# Patient Record
Sex: Female | Born: 1975
Health system: Southern US, Community
[De-identification: ages and names within clinical notes are randomized; demographics above are authoritative.]

## PROBLEM LIST (undated history)

## (undated) ENCOUNTER — Inpatient Hospital Stay: Payer: Self-pay

## (undated) DIAGNOSIS — O24419 Gestational diabetes mellitus in pregnancy, unspecified control: Secondary | ICD-10-CM

## (undated) DIAGNOSIS — N809 Endometriosis, unspecified: Secondary | ICD-10-CM

## (undated) DIAGNOSIS — R030 Elevated blood-pressure reading, without diagnosis of hypertension: Secondary | ICD-10-CM

## (undated) HISTORY — PX: DILATION AND CURETTAGE OF UTERUS: SHX78

---

## 2004-07-24 ENCOUNTER — Observation Stay: Payer: Self-pay | Admitting: Obstetrics and Gynecology

## 2004-08-07 ENCOUNTER — Observation Stay: Payer: Self-pay | Admitting: Obstetrics and Gynecology

## 2004-08-08 ENCOUNTER — Observation Stay: Payer: Self-pay | Admitting: Obstetrics and Gynecology

## 2004-08-25 ENCOUNTER — Observation Stay: Payer: Self-pay

## 2004-08-27 ENCOUNTER — Observation Stay: Payer: Self-pay

## 2004-09-07 ENCOUNTER — Observation Stay: Payer: Self-pay | Admitting: Certified Nurse Midwife

## 2004-09-13 ENCOUNTER — Inpatient Hospital Stay: Payer: Self-pay | Admitting: Obstetrics and Gynecology

## 2006-08-01 ENCOUNTER — Emergency Department: Payer: Self-pay | Admitting: General Practice

## 2008-06-30 ENCOUNTER — Emergency Department (HOSPITAL_COMMUNITY): Admission: EM | Admit: 2008-06-30 | Discharge: 2008-06-30 | Payer: Self-pay | Admitting: Emergency Medicine

## 2009-03-11 ENCOUNTER — Emergency Department: Payer: Self-pay | Admitting: Internal Medicine

## 2011-07-23 LAB — URINALYSIS, ROUTINE W REFLEX MICROSCOPIC
Bilirubin Urine: NEGATIVE
Hgb urine dipstick: NEGATIVE
Specific Gravity, Urine: <1.005 — ABNORMAL LOW
pH: 6

## 2011-07-23 LAB — URINE CULTURE

## 2013-03-25 ENCOUNTER — Ambulatory Visit: Payer: Self-pay | Admitting: Obstetrics and Gynecology

## 2013-09-13 ENCOUNTER — Ambulatory Visit: Payer: Self-pay | Admitting: Obstetrics and Gynecology

## 2013-09-13 LAB — HEMOGLOBIN: HGB: 13.1 g/dL (ref 12.0–16.0)

## 2013-09-13 LAB — URINALYSIS, COMPLETE
Bilirubin,UR: NEGATIVE
Ketone: NEGATIVE
Protein: NEGATIVE
Specific Gravity: 1.026 (ref 1.003–1.030)

## 2013-09-23 ENCOUNTER — Ambulatory Visit: Payer: Self-pay | Admitting: Obstetrics and Gynecology

## 2013-10-20 HISTORY — PX: DIAGNOSTIC LAPAROSCOPY: SUR761

## 2014-05-16 ENCOUNTER — Emergency Department: Payer: Self-pay | Admitting: Emergency Medicine

## 2014-05-16 LAB — CBC
HCT: 39.8 % (ref 35.0–47.0)
HGB: 13.3 g/dL (ref 12.0–16.0)
MCH: 29.8 pg (ref 26.0–34.0)
MCHC: 33.5 g/dL (ref 32.0–36.0)
MCV: 89 fL (ref 80–100)
Platelet: 248 10*3/uL (ref 150–440)
RBC: 4.47 10*6/uL (ref 3.80–5.20)
RDW: 12.7 % (ref 11.5–14.5)
WBC: 9 10*3/uL (ref 3.6–11.0)

## 2014-05-16 LAB — HCG, QUANTITATIVE, PREGNANCY: Beta Hcg, Quant.: 6362 m[IU]/mL — ABNORMAL HIGH

## 2014-06-06 ENCOUNTER — Ambulatory Visit: Payer: Self-pay | Admitting: Obstetrics and Gynecology

## 2014-06-06 LAB — HEMOGLOBIN: HGB: 13.5 g/dL (ref 12.0–16.0)

## 2014-06-07 ENCOUNTER — Ambulatory Visit: Payer: Self-pay | Admitting: Obstetrics and Gynecology

## 2014-06-09 LAB — PATHOLOGY REPORT

## 2014-10-20 DIAGNOSIS — O24419 Gestational diabetes mellitus in pregnancy, unspecified control: Secondary | ICD-10-CM

## 2014-10-20 HISTORY — PX: TUBAL LIGATION: SHX77

## 2014-10-20 HISTORY — DX: Gestational diabetes mellitus in pregnancy, unspecified control: O24.419

## 2015-02-09 NOTE — Op Note (Signed)
PATIENT NAME:  Lindsey Bell, Lindsey Bell MR#:  [id number] DATE OF BIRTH:  1976/07/22  DATE OF PROCEDURE:  09/23/2013  PREOPERATIVE DIAGNOSIS: Pelvic pain.   POSTOPERATIVE DIAGNOSES: Pelvic pain plus adhesive disease and possible endometriosis.   SURGERY:  1. Diagnostic laparoscopy.  2. Adhesiolysis, moderate.  3. Ablation of endometriosis.   SURGEON: Ricky L. Logan BoresEvans, MD  ANESTHESIA: General endotracheal.   FINDINGS: Omental adhesions in the midline, approximately midway between the umbilicus and pubis. Scar tissue at the junction of the right tube to the uterus. Significant scarring along the right uterosacral. What appeared to be deposits of white endometriosis in the left posterior cul-de-sac and some red endometriosis at the left ovarian fossa.   ESTIMATED BLOOD LOSS: Minimal.   COMPLICATIONS: None.  DRAIN: In-and-out catheter with a red rubber at the beginning of the case of 50 mL of clear urine.   PROCEDURE IN DETAIL: The patient was consented, taken to the operating room and placed in the supine position, where anesthesia was initiated, and placed in dorsal lithotomy position. Cervix was visualized. A single-tooth and sound were placed in the usual fashion and Steri-Stripped together. Bladder was drained.   An 11 port was placed infraumbilically, and pneumoperitoneum was established. We immediately noted midline omental adhesion.   The 5 mm ports were then placed in bilateral lower quadrants under direct visualization. The patient was placed in Trendelenburg position. We were unable to get to the adhesion close to the abdominal wall from below, and therefore we switched to a dogleg scope, and using long Kleppinger'Bell and long scissors, were able to dissect free the omental adhesions in hemostatic fashion, removing it immediately adjacent to its junction with the abdominal wall.   With the aid of retraction and steeper Trendelenburg, we were able to visualize the pelvis. Again, she had  significant blanching and tightening of the right uterosacral. Most of her pain was to the right side. Again, there was a small area of scar tissue and almost stellate characteristic at the cornua at the right fallopian tube. Possible white endometriosis, posterior cul-de-sac, and red endometriosis at the left fossa, as described above.   We switched back to the single channel scope and regular sized instruments, and then under direct visualization, taking care to avoid bowel and ureter, cauterized the uterosacral and divided it, with immediate release of pressure noted. These areas were seen to be hemostatic.   The areas of white endometriosis in the posterior cul-de-sac were then cauterized, as was the area of red endometriosis in the left ovarian fossa, again taking care to avoid the ureter.   The area at the junction of the right fallopian tube and the uterus was then lightly cauterized and excised using sharp scissors.   Appendix was inspected and seemed to be unremarkable, upper abdomen unremarkable.   Pressure was lowered to 6 mmHg, and these areas were seen to be hemostatic. Pneumoperitoneum was allowed to resolve, and the ports were removed. Incisions were closed with deep of 0 Vicryl. Steri-Strips and Band-Aids were applied.   All instrument, needle and sponge counts were correct. Used 30 mL of 0.5 Sensorcaine in three 10 mL aliquots prior to port placements. The patient tolerated the procedure well and anticipate a routine postoperative course.  ____________________________ Reatha Harpsicky L. Logan BoresEvans, MD rle:lb D: 09/23/2013 08:30:23 ET T: 09/23/2013 08:51:54 ET JOB#: [id number]  cc: Ricky L. Logan BoresEvans, MD, <Dictator> Augustina MoodICK L Mackey Varricchio MD ELECTRONICALLY SIGNED 09/23/2013 12:14

## 2015-02-10 NOTE — Op Note (Signed)
PATIENT NAME:  Lindsey Bell, [name] MR#:  [id number] DATE OF BIRTH:  07/24/1976  DATE OF PROCEDURE:  06/07/2014  PREOPERATIVE DIAGNOSIS:  Incomplete abortion.  POSTOPERATIVE DIAGNOSIS:  Incomplete abortion.  PROCEDURE: Suction dilation and curettage.   SURGEON: Jennell Cornerhomas Schermerhorn, M.D.   ANESTHESIA: LMA.   INDICATIONS: A 39 year old gravida 2, para 1 patient with last menstrual period of 02/27/2014.  A patient with bleeding and cramping for 3 weeks, and ultrasound documents fetal demise following quantitative beta-hCG blood type A+.   PROCEDURE: After adequate general LMA, the patient was placed in dorsal supine position, legs in candy cane stirrups. Lower abdomen, perineum and vagina were prepped in normal fashion. Straight catheterization of the bladder yielded 150 mL of clear urine. A weighted speculum was placed in the posterior vaginal vault. The anterior cervix was grasped with a single-tooth tenaculum and the cervix was dilated to #20 Hanks dilator without difficulty.  A #8 flexible curette was placed into the endometrial cavity. Tissue removed consistent with products of conception. Sharp curettage was performed followed by a repeat suction curettage. No additional tissue. The patient did have a small amount of cervical oozing requiring 0.2 mg intramuscular Methergine to be administered. Good hemostasis ultimately was noted.   ESTIMATED BLOOD LOSS: 25 mL.   URINE OUTPUT: 150 mL.  INTRAOPERATIVE FLUIDS: 400 mL.   DISPOSITION:  The patient was taken to the recovery room in good condition.   ____________________________ Suzy Bouchardhomas J. Schermerhorn, MD tjs:TT D: 06/07/2014 08:05:21 ET T: 06/07/2014 14:19:12 ET JOB#: [id number]  cc: Suzy Bouchardhomas J. Schermerhorn, MD, <Dictator> Suzy BouchardHOMAS J SCHERMERHORN MD ELECTRONICALLY SIGNED 06/10/2014 11:26

## 2015-03-05 LAB — OB RESULTS CONSOLE ABO/RH: RH Type: POSITIVE

## 2015-03-05 LAB — OB RESULTS CONSOLE RUBELLA ANTIBODY, IGM: RUBELLA: IMMUNE

## 2015-03-05 LAB — OB RESULTS CONSOLE HIV ANTIBODY (ROUTINE TESTING): HIV: NONREACTIVE

## 2015-03-05 LAB — OB RESULTS CONSOLE VARICELLA ZOSTER ANTIBODY, IGG: VARICELLA IGG: IMMUNE

## 2015-03-05 LAB — OB RESULTS CONSOLE RPR: RPR: NONREACTIVE

## 2015-03-05 LAB — OB RESULTS CONSOLE ANTIBODY SCREEN: ANTIBODY SCREEN: NEGATIVE

## 2015-03-05 LAB — OB RESULTS CONSOLE HEPATITIS B SURFACE ANTIGEN: Hepatitis B Surface Ag: NEGATIVE

## 2015-03-15 ENCOUNTER — Ambulatory Visit
Admission: RE | Admit: 2015-03-15 | Discharge: 2015-03-15 | Disposition: A | Discharge status: Home / Self Care | Payer: Managed Care, Other (non HMO) | Source: Ambulatory Visit | Attending: Maternal & Fetal Medicine | Admitting: Maternal & Fetal Medicine

## 2015-03-15 VITALS — BP 128/87 | HR 92 | Temp 98.9°F | Resp 18 | Ht 65.0 in | Wt 272.0 lb

## 2015-03-15 DIAGNOSIS — O99810 Abnormal glucose complicating pregnancy: Secondary | ICD-10-CM

## 2015-03-15 DIAGNOSIS — O34219 Maternal care for unspecified type scar from previous cesarean delivery: Secondary | ICD-10-CM

## 2015-03-15 DIAGNOSIS — O3421 Maternal care for scar from previous cesarean delivery: Secondary | ICD-10-CM

## 2015-03-15 DIAGNOSIS — E669 Obesity, unspecified: Secondary | ICD-10-CM

## 2015-03-15 DIAGNOSIS — Z3A09 9 weeks gestation of pregnancy: Secondary | ICD-10-CM

## 2015-03-15 DIAGNOSIS — O99212 Obesity complicating pregnancy, second trimester: Secondary | ICD-10-CM | POA: Diagnosis not present

## 2015-03-15 DIAGNOSIS — R030 Elevated blood-pressure reading, without diagnosis of hypertension: Secondary | ICD-10-CM

## 2015-03-15 HISTORY — DX: Elevated blood-pressure reading, without diagnosis of hypertension: R03.0

## 2015-03-15 LAB — GLUCOSE, CAPILLARY: GLUCOSE-CAPILLARY: 89 mg/dL (ref 65–99)

## 2015-03-15 NOTE — Progress Notes (Addendum)
[facility] Consultation   Chief Complaint: Abnormal 1 hour glucose screen (194)  HPI: Lindsey Bell is a 39 y.o. G3 para 1011 at [redacted]w[redacted]d gestation by LMP and [redacted]w[redacted]d US performed 03/05/2015 who is referred by [facility] regarding her borderline HTN, elevated 1 hour glucose screen.  The patient's pregnancy is also complicated by her morbid obesity.  The patient had a one hour glucose screen on 03/05/2015 which was 194.  She has taken metformin in the past for a diagnosis of PCOS.  The patient has never been treated with antihypertensives, but her blood pressure has been intermittently elevated.  Obstetric History:  1. 09/13/2004 - LTCS of 8 lb 3 oz female at [redacted] weeks gestation for pregnancy complicated by HTN.   2. 05/30/2014 - SAB at [redacted] weeks gestation - anembryonic gestation.  Gynecologic History:  LMP 01/08/2015 Endometriosis diagnosed 2014 PCOS - took metformin  Past Medical History: Patient  has a past medical history of Borderline hypertension.   Past Surgical History: She  has past surgical history that includes Cesarean section (2005) and Diagnostic laparoscopy (2015).   Medications: PN vitamins  Allergies: Patient has No Known Allergies.   Social History: Patient  reports that she has never smoked. She does not have any smokeless tobacco history on file. She reports that she does not drink alcohol or use illicit drugs.  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  Her husband works for a Public house manager firm in [place].  Family History: family history includes Hypertension in her father and mother; Stroke in her maternal grandfather.   Review of Systems:  With the exception of nausea of pregnancy,  a full 12 point review of systems was negative.  Physical Exam: BP 128/87 mmHg  Pulse 92  Temp(Src) 98.9 F (37.2 C)  Resp 18  Ht  (1.651 m)  Wt 272 lb (123.378 kg)  BMI 45.26 kg/m2  SpO2 98%  A full exam was performed at [facility] The  patient is here for advice only  Asessement: 1. Abnormal glucose screen 2. Maternal obesity (morbid) with BMI > 40 3. Borderline HTN 4. Previous cesarean delivery 5. Advanced maternal age  Recommendations: 1. Abnormal glucose screen -- Since the one hour screen is below 200, we would recommend performing a 3 hr GTT prior to making the diagnosis of gestational or pre-gestational diabetes. -- The patient was scheduled to return on Monday, May 30 for a 3 hr GTT here at [facility]. -- If gestational or pre-gestational diabetes is confirmed, we will be happy to see her for assistance with blood sugar management.  We would likely start with metformin, since she has taken this before.  2. Maternal obesity (morbid) with BMI > 40 Now:  We would recommend an extra 1 mg per day of  folic acid.  A prescription was given to her.  The patient will ask her husband if she snores.  If she does, we would recommend pursuing an evaluation for sleep apnea, which could be a contributing factor to her HTN.   We would recommend baseline laboratory studies to include an EKG, a complete metabolic profile, a P/C ratio and a TSH.  The patient can have this blood work at her 12 week visit.  The patient should have a hemoglobin A1C. She can have this drawn at her 12 week visit.  Even if her hemoglobin A1C and her 3 hr GTT are normal, since she failed her early 1 hr screen, she  should have a repeat 3 hr GTT around [redacted] weeks gestation.  The patient was counseled  today about limiting her weight gain this pregnancy to 10-15 lbs. Second trimester:  After [redacted] weeks gestation, we would recommend low-dose aspirin 81 mg per day.  A prescription was given to her.  The patient should have a detailed anatomy ultrasound.  Third trimester:  We would recommend an anesthesia consult in the third trimester.   We would recommend fetal surveillance to include monthly ultrasounds starting at 28 weeks.   See additional recommendations for  fetal surveillance below under borderline HTN. At delivery:  We would recommend pneumatic compression devices in labor and postpartum.  We would recommend consideration of 3 gm instead of 2 gm of cefazolin for prophylaxis at the time of her repeat cesarean delivery.  At the time of a cesarean delivery, we would recommend application of a negative pressure dressing.   BMI > 40 increases the risk of thrombosis 4-5 fold.  We would suggest consideration of enoxaparin 40 mg qd x 3 weeks starting at 24 hours post-cesarean, especially if she develops any other risk factors.    Postpartum:  We would recommend referral for weight loss management and consideration of bariatric surgery. 3. Borderline HTN  After [redacted] weeks gestation, we would recommend low-dose aspirin 81 mg per day.  A prescription was given to her.  Besides monthly ultrasounds starting at 28 weeks, we would recommend weekly antepartum testing starting at 32-[redacted] weeks gestation.  If she develops overt hypertension or diabetes, the frequency should be increased to twice weekly starting at [redacted] weeks gestation.  She should be delivered at [redacted] weeks gestation or sooner depending on maternal or fetal status. 4. Previous cesarean delivery  The patient desires repeat cesarean delivery.    Given her risk factors, she should be offered BTL at the time of surgery. 5.  Advanced maternal age  The patient will have cell free fetal DNA testing at [redacted] weeks gestation.  Fetal surveillance as above.  Total time spent with the patient was 60 minutes with greater than 50% spent in counseling and coordination of care.  We appreciate this consult and will be happy to be involved in the ongoing care of Lindsey Bell in anyway her obstetricians desire.  Argentina Ponder, MD [facility]  Addendum:  3 hr GTT from 03/19/2015 - 99/134/146/83 which is normal using the National Diabetes Group Criteria.  Message left that her GTT was normal, although her FBS  was borderline (and would be abnormal using the Carpenter/Coustan criteria).  At a minimum we would repeat her GTT at 28 weeks and she should have me paged on 03/22/2015.  At that time, we will recommend a diabetic diet, and we can discuss her restarting metformin. I apprised Dr. Dolphus Jenny of this patient and her results.   In the event I am unavailable, the patient can speak to Dr. Dolphus Jenny.  Argentina Ponder, MD [facility]  03/28/2105 - I left a message for the patient and she called me back.  She had received my previous message.  In order to be optimally pro-active in her situation, I recommended restarting metformin 500 mg po bid and monitoring her blood sugars - one fasting BS qd and one rotating 2 hr PP.  A prescription for metformin was called to her pharmacy and she was scheduled an appointment with [facility] to be instructed in blood glucose monitoring.  She will return to [facility] in one month.  Lindsey Bell  Lindsey GitelmanH. Oluwakemi Salsberry, MD [facility]

## 2015-03-19 ENCOUNTER — Other Ambulatory Visit
Admission: RE | Admit: 2015-03-19 | Discharge: 2015-03-19 | Disposition: A | Discharge status: Home / Self Care | Payer: BLUE CROSS/BLUE SHIELD | Source: Ambulatory Visit | Attending: Maternal & Fetal Medicine | Admitting: Maternal & Fetal Medicine

## 2015-03-19 DIAGNOSIS — O99810 Abnormal glucose complicating pregnancy: Secondary | ICD-10-CM | POA: Diagnosis not present

## 2015-03-19 LAB — GLUCOSE, FASTING: GLUCOSE, FASTING: 99 mg/dL (ref 65–99)

## 2015-03-19 LAB — GLUCOSE, 1 HOUR GESTATIONAL: GLUCOSE, 1 HOUR-GESTATIONAL: 134 mg/dL (ref 70–189)

## 2015-03-19 LAB — GLUCOSE, 3 HOUR GESTATIONAL: GLUCOSE 3 HOUR GTT: 83 mg/dL (ref 70–144)

## 2015-03-19 LAB — GLUCOSE, 2 HOUR GESTATIONAL: GLUCOSE, 2 HOUR-GESTATIONAL: 146 mg/dL (ref 70–164)

## 2015-03-20 NOTE — Addendum Note (Signed)
Encounter addended by: Lady DeutscherAndra Roxsana Riding, MD on: 03/20/2015  2:15 PM<BR>     Documentation filed: Notes Section

## 2015-03-29 NOTE — Addendum Note (Signed)
Encounter addended by: Lady Deutscher, MD on: 03/29/2015 10:25 AM<BR>     Documentation filed: Notes Section, Letters

## 2015-04-16 ENCOUNTER — Encounter: Payer: BLUE CROSS/BLUE SHIELD | Attending: Maternal & Fetal Medicine | Admitting: *Deleted

## 2015-04-16 ENCOUNTER — Encounter: Payer: Self-pay | Admitting: *Deleted

## 2015-04-16 VITALS — BP 130/84 | Ht 65.0 in | Wt 273.2 lb

## 2015-04-16 DIAGNOSIS — O24419 Gestational diabetes mellitus in pregnancy, unspecified control: Secondary | ICD-10-CM

## 2015-04-16 NOTE — Patient Instructions (Signed)
Read booklet on Gestational Diabetes Follow Gestational Meal Planning Guidelines Complete a 3 Day Food Record and bring to next appointment Check blood sugars 4 x day - before breakfast and 2 hrs after every meal and record  Call MD for prescription for meter strips and lancets Strips   One Touch Ultra Blue  Lancets   One Touch Delica Bring blood sugar log to next appointment Walk 20-30 minutes at least 5 x week if permitted by MD

## 2015-04-16 NOTE — Progress Notes (Signed)
Diabetes Self-Management Education  Visit Type: First/Initial  Appt. Start Time: 0845 Appt. End Time: 1015  04/16/2015  Ms. Lindsey Bell, identified by name and date of birth, is a 39 y.o. female with a diagnosis of Diabetes: Gestational Diabetes.    ASSESSMENT Blood pressure 130/84, height 5\' 5"  (1.651 m), weight 273 lb 3.2 oz (123.923 kg), last menstrual period 01/08/2015. Body mass index is 45.46 kg/(m^2).  Initial Visit Information: Are you currently following a meal plan?: Yes What type of meal plan do you follow?: eating smaller meals and more often; cut out white bread Are you taking your medications as prescribed?: Yes Are you checking your feet?: No How often do you need to have someone help you when you read instructions, pamphlets, or other written materials from your doctor or pharmacy?: 1 - Never What is the last grade level you completed in school?: 12th  Psychosocial: Patient Belief/Attitude about Diabetes: Motivated to manage diabetes Self-care barriers: None Self-management support: Doctor's office, Family Patient Concerns: Nutrition/Meal planning, Healthy Lifestyle Special Needs: None Preferred Learning Style: Auditory, Visual Learning Readiness: Change in progress  Complications:  How often do you check your blood sugar?: 3-4 times/day Fasting Blood glucose range (mg/dL): 96-04570-129 (Pt reports FBG's 91-104 mg/dL) Postprandial Blood glucose range (mg/dL): 40-98170-129 (Pt reports pp 75-105 mg/dL with 1 reading of 191138 mg/dL when she ate out. ) Have you had a dilated eye exam in the past 12 months?: No Have you had a dental exam in the past 12 months?: No  Diet Intake: Breakfast: eats 3  meals/day Snack (morning): eats 2 snacks/day  Exercise: Exercise: ADL's  Individualized Plan for Diabetes Self-Management Training:  Learning Objective:  Patient will have a greater understanding of diabetes self-management.  Education Topics Reviewed with Patient  Today: Definition of diabetes, type 1 and 2, and the diagnosis of diabetes, Factors that contribute to the development of diabetes Role of diet in the treatment of diabetes and the relationship between the three main macronutrients and blood glucose level Role of exercise on diabetes management, blood pressure control and cardiac health. Reviewed patients medication for diabetes, action, purpose, timing of dose and side effects. Purpose and frequency of SMBG., Identified appropriate SMBG and/or A1C goals. Relationship between chronic complications and blood glucose control Identified and addressed patients feelings and concerns about diabetes Pregnancy and GDM  Role of pre-pregnancy blood glucose control on the development of the fetus, Reviewed with patient blood glucose goals with pregnancy  PATIENTS GOALS/Plan (Developed by the patient): Lead a healthier lifestyle  Plan:  Read booklet on Gestational Diabetes Follow Gestational Meal Planning Guidelines Complete a 3 Day Food Record and bring to next appointment Check blood sugars 4 x day - before breakfast and 2 hrs after every meal and record  Call MD for prescription for meter strips and lancets Strips   One Touch Ultra Blue  Lancets   One Touch Delica Bring blood sugar log to next appointment Walk 20-30 minutes at least 5 x week if permitted by MD  Expected Outcomes:  Demonstrated interest in learning. Expect positive outcomes  Education material provided:  Gestational Meal Planning Guidelines Viewed Gestational Diabetes Video 3 Day Food Record Goals for a Healthy Pregnancy  If problems or questions, patient to contact team via:   Sharion SettlerSheila Shotwell, RN, CCM, CDE [phone]  Future DSME appointment:  Tuesday May 08, 2015 at 9:00 am with dietitian

## 2015-04-26 ENCOUNTER — Ambulatory Visit
Admission: RE | Admit: 2015-04-26 | Discharge: 2015-04-26 | Disposition: A | Discharge status: Home / Self Care | Payer: BLUE CROSS/BLUE SHIELD | Source: Ambulatory Visit | Attending: Obstetrics and Gynecology | Admitting: Obstetrics and Gynecology

## 2015-04-26 VITALS — BP 124/82 | HR 91 | Temp 98.3°F | Resp 18 | Ht 66.0 in | Wt 271.2 lb

## 2015-04-26 DIAGNOSIS — O09522 Supervision of elderly multigravida, second trimester: Secondary | ICD-10-CM | POA: Diagnosis not present

## 2015-04-26 DIAGNOSIS — O24419 Gestational diabetes mellitus in pregnancy, unspecified control: Secondary | ICD-10-CM

## 2015-04-26 DIAGNOSIS — O99212 Obesity complicating pregnancy, second trimester: Secondary | ICD-10-CM

## 2015-04-26 DIAGNOSIS — Z3A15 15 weeks gestation of pregnancy: Secondary | ICD-10-CM | POA: Diagnosis not present

## 2015-04-26 DIAGNOSIS — Z9889 Other specified postprocedural states: Secondary | ICD-10-CM

## 2015-04-26 DIAGNOSIS — E669 Obesity, unspecified: Secondary | ICD-10-CM

## 2015-04-26 DIAGNOSIS — O169 Unspecified maternal hypertension, unspecified trimester: Secondary | ICD-10-CM | POA: Insufficient documentation

## 2015-04-26 DIAGNOSIS — Z8742 Personal history of other diseases of the female genital tract: Secondary | ICD-10-CM

## 2015-04-26 DIAGNOSIS — Z98891 History of uterine scar from previous surgery: Secondary | ICD-10-CM | POA: Insufficient documentation

## 2015-04-26 DIAGNOSIS — O162 Unspecified maternal hypertension, second trimester: Secondary | ICD-10-CM

## 2015-04-26 DIAGNOSIS — N809 Endometriosis, unspecified: Secondary | ICD-10-CM | POA: Diagnosis not present

## 2015-04-26 DIAGNOSIS — O09529 Supervision of elderly multigravida, unspecified trimester: Secondary | ICD-10-CM | POA: Insufficient documentation

## 2015-04-26 NOTE — Consult Note (Addendum)
[facility] Consultation   Chief Complaint: pregnant with gestational diabetes and h/o chronic HTN  HPI: Ms. Lindsey Bell is a 39 y.o. G3 para 1011 at 32 w 3 gestation by LMP and [redacted]w[redacted]d US performed 03/05/2015 who is referred by [facility] regarding her borderline HTN, borderline glucose tolerance ( early GCT 194/ normal early GTT 99/134/146/83 ,Dr Fayrene Fearing placed her on metformin ,  morbid obesity BMI 40 .  The patient had a one hour glucose screen on 03/05/2015 which was 194. She has taken metformin in the past for a diagnosis of PCOS. She re- started metformin after her consult with Dr Fayrene Fearing and has been checking her blood sugars 4 times /daily.   The patient has never been treated with antihypertensives, but her blood pressure has been intermittently elevated.  Obstetric History:  1. 09/13/2004 - LTCS of 8 lb 3 oz female at [redacted] weeks gestation for pregnancy complicated by HTN.  2. 05/30/2014 - SAB at [redacted] weeks gestation - anembryonic gestation.  Gynecologic History: LMP 01/08/2015 Endometriosis diagnosed 2014 PCOS - took metformin  Past Medical History: Patient  has a past medical history of Borderline hypertension.   Past Surgical History: She  has past surgical history that includes Cesarean section (2005) and Diagnostic laparoscopy (2015) for endometriosis (she conceived shortly after)   Medications: PN vitamins  Allergies: Patient has No Known Allergies.   Social History: Patient  reports that she has never smoked. She does not have any smokeless tobacco history on file. She reports that she does not drink alcohol or use illicit drugs.  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  Family History: family history includes Hypertension in her father and mother; Stroke in her maternal grandfather.   Review of Systems: some breast tenderness  a full 12 point review of systems was negative.  Physical  Exam: Filed Vitals:   04/26/15 0918  BP: 124/82  Pulse: 91  Temp: 98.3 F (36.8 C)  Resp: 18  obese pleasant white female in no distress  Her weight has dropped one pound to 271  Scar is well healed - minimal pannus most of her weight is higher on her abdomen  FHR =150   BS last night 102  All between 85-115  Single BS of 138 after eating at the [facility]   Asessement: 1. Abnormal glucose tolerance early GCT 194 /normal GTT now on metformin  2. Maternal obesity (morbid) with BMI > 40 3. Borderline HTN no meds on aspirin 4. Previous cesarean delivery- plans repeat  5. Advanced maternal age- awaiting cell free results   Recommendations: 1. Glucose intolerance  - doing well on metformin  All blood sugars are normal on her machine except for 138 that occurred after eating out for Fathers day  I told her she could go to qod blood sugar checks with care to stay on diet on her off days  Continue metformin - I reviewed the expanding safety data for use in pregnancy  Strips were re-filled for every other day use 4 times/day Dr Fayrene Fearing had recommended a third tri GTT , given pt had a near diagnostic GCT early on  (I use 200) I would manage as GDM - with her other co morbidities it will not change OB management significantly 2. Maternal obesity (morbid) with BMI > 40 She has done a good job of limiting  Second trimester:  The patient should have  a detailed anatomy ultrasound. Third trimester:  We would recommend an anesthesia consult in the third trimester.   We would recommend fetal surveillance to include monthly ultrasounds starting at 28 weeks. See additional recommendations for fetal surveillance below under borderline HTN. At delivery:  We would recommend pneumatic compression devices in labor and postpartum.  We would recommend consideration of 3 gm instead of 2 gm of cefazolin for prophylaxis at the time of her repeat cesarean delivery.  At the time of a cesarean  delivery, we would recommend application of a negative pressure dressing if available .   If prolonged immobilization consider prophylactic lovenox for increased risk of DVT  Postpartum:  We would recommend referral for weight loss management and consideration of bariatric surgery. 3. Borderline HTN- normotensive today   On  low-dose aspirin 81 mg per day.   Recommend baseline urine p/c and renal and liver chemistries if not already done   Besides monthly ultrasounds starting at 28 weeks, we would recommend weekly antepartum testing starting at 32-[redacted] weeks gestation. If she develops overt hypertension or diabetes, the frequency should be increased to twice weekly starting at [redacted] weeks gestation. She should be delivered at [redacted] weeks gestation or sooner depending on maternal or fetal status. 4. Previous cesarean delivery 2005  The patient desires repeat cesarean delivery.  5. Advanced maternal age  The patient is awaiting  cell free fetal DNA testing results In summary  RTC in 8 weeks or sooner if insulin is needed or if BP increases Anatomy u/s needed at 18 weeks  AFP only is an option if spine not well seen or mother would like for reassurance  QOD sugars, if remain normal can decrease frequency further  Continue aspirin Baseline labs cmp , urine p/c , hgb A1c needed ( not drawn here)   I spent 20 minutes with the pt Jimmey RalphLivingston, Andraya Frigon, MD

## 2015-05-07 ENCOUNTER — Ambulatory Visit: Payer: Managed Care, Other (non HMO)

## 2015-05-08 ENCOUNTER — Ambulatory Visit: Payer: Managed Care, Other (non HMO) | Admitting: Dietician

## 2015-05-11 ENCOUNTER — Encounter: Payer: BLUE CROSS/BLUE SHIELD | Attending: Maternal & Fetal Medicine | Admitting: Dietician

## 2015-05-11 VITALS — BP 120/76 | Ht 65.0 in | Wt 270.1 lb

## 2015-05-11 DIAGNOSIS — O24419 Gestational diabetes mellitus in pregnancy, unspecified control: Secondary | ICD-10-CM | POA: Diagnosis present

## 2015-05-11 NOTE — Patient Instructions (Signed)
   Include protein and solid starch such as graham crackers with nighttime snack to take Metformin.   Waking up and not sleeping well during the night can also increase morning blood sugars.  When not feeling well, eat small amounts of foods that appeal to you every 2-3 hours.

## 2015-05-11 NOTE — Progress Notes (Signed)
   Patient's BG record indicates BGs generally within goal ranges. She will start taking Metformin at bedtime with a snack for more improvement in fasting BGs (per MD recommendation).   Patient's food diary indicates overall balanced meals and controlled carb intake. She reports poor appetite, particularly in the past week, which might be due to recently diagnosed UTI.           She is eating every few hours in effort to prevent nausea. Breakfast meal and some snacks lack a protein source.   Provided 1800kcal meal plan, and wrote individualized menus based on patient's food preferences.  Advised patient to include solid food including protein source for evening snack to take Metformin in order to avoid GI distress.   Instructed patient on food safety, including avoidance of Listeriosis, and limiting mercury from fish.  Discussed importance of maintaining healthy lifestyle habits to reduce risk of Type 2 DM as well as Gestational DM with any future pregnancies.  Advised patient to use any remaining testing supplies to test some BGs after delivery, and to have BG tested ideally annually, as well as prior to attempting future pregnancies.

## 2015-06-28 ENCOUNTER — Ambulatory Visit: Payer: Managed Care, Other (non HMO)

## 2015-07-05 ENCOUNTER — Ambulatory Visit
Admission: RE | Admit: 2015-07-05 | Discharge: 2015-07-05 | Disposition: A | Discharge status: Home / Self Care | Payer: Managed Care, Other (non HMO) | Source: Ambulatory Visit | Attending: Obstetrics & Gynecology | Admitting: Obstetrics & Gynecology

## 2015-07-05 VITALS — BP 119/82 | HR 100 | Temp 98.3°F | Resp 18 | Ht 64.8 in | Wt 268.2 lb

## 2015-07-05 DIAGNOSIS — O24419 Gestational diabetes mellitus in pregnancy, unspecified control: Secondary | ICD-10-CM

## 2015-07-05 DIAGNOSIS — Z98891 History of uterine scar from previous surgery: Secondary | ICD-10-CM

## 2015-07-05 DIAGNOSIS — O99212 Obesity complicating pregnancy, second trimester: Secondary | ICD-10-CM

## 2015-07-05 DIAGNOSIS — O162 Unspecified maternal hypertension, second trimester: Secondary | ICD-10-CM

## 2015-07-05 DIAGNOSIS — O09522 Supervision of elderly multigravida, second trimester: Secondary | ICD-10-CM

## 2015-07-05 HISTORY — DX: Gestational diabetes mellitus in pregnancy, unspecified control: O24.419

## 2015-07-05 NOTE — Progress Notes (Addendum)
Maternal-Fetal Medicine Return Consult: Margareth is a 39 year-old G3 P1011 at 51 3/7 weeks (EDC 10/15/15) who returns for followup consult. See prior MFM consult notes dated 04/26/15 (Dr. Leatha Gilding) and 03/15/15.  Sumie is being followed for elevated BMI, advanced maternal age, history of prior cesarean delivery, history of hypertension and glucose intolerance in pregnancy.  She had an early one-hour glucola of 194 followed by a three hour GTT of 99/134/146/83.  Due to her borderline values and history of PCOS, she was started on metformin and has been checking her sugars. She had been on metformin 500 mg every 12 hours, and was increased last week at [facility] to 1000 mg every 12 hours due to mildly elevated fasting and 2hr postprandial dinner values.  Sundeep is without complaint. She states she had a normal anatomy scan at [facility] and had normal cell-free fetal DNA testing there as well.  She is tolerating her metformin and taking a daily baby aspirin.  Exam: Filed Vitals:   07/05/15 0900  BP: 119/82  Pulse: 100  Temp: 98.3 F (36.8 C)  Resp: 18   Sugars (since increasing to Metformin  twice daily): FS: 100, 91, 93, 98, 99, 100, 94 2hr PP breakfast: 97, 123, 96, 99, 10, 107 2hr PP lunch: 104, 114, 109, 104, 110, 103 2hr PP dinner: 130, 123, 119, 136, 120, 123  Assessment and Recommendations: -Blood sugars are largely in range though fasting values are slightly elevated. Pt will continue to adjust her diet, avoiding concentrated sweets and limiting carbs -Please check a hemoglobin A1C if not done already. Notes -Pregnancy/fetal testing recs as outline in two prior consults -Timing of delivery dependent on control. Pt desires repeat cesarean -Continue metformin at current dose for now.  Call with questions. RTC 4 weeks or sooner if issues arise -Baby aspirin, folic acid and test strip renewal scripts provided (called in)  Eamon Tantillo, [place] A, MD

## 2015-07-26 ENCOUNTER — Encounter: Payer: Self-pay | Admitting: *Deleted

## 2015-07-26 ENCOUNTER — Inpatient Hospital Stay
Admission: EM | Admit: 2015-07-26 | Discharge: 2015-07-26 | Disposition: A | Discharge status: Home / Self Care | Payer: BLUE CROSS/BLUE SHIELD | Attending: Advanced Practice Midwife | Admitting: Advanced Practice Midwife

## 2015-07-26 DIAGNOSIS — O09522 Supervision of elderly multigravida, second trimester: Secondary | ICD-10-CM

## 2015-07-26 DIAGNOSIS — Z3A28 28 weeks gestation of pregnancy: Secondary | ICD-10-CM | POA: Insufficient documentation

## 2015-07-26 DIAGNOSIS — O162 Unspecified maternal hypertension, second trimester: Secondary | ICD-10-CM | POA: Diagnosis not present

## 2015-07-26 DIAGNOSIS — O24419 Gestational diabetes mellitus in pregnancy, unspecified control: Secondary | ICD-10-CM | POA: Diagnosis not present

## 2015-07-26 DIAGNOSIS — E669 Obesity, unspecified: Secondary | ICD-10-CM | POA: Diagnosis not present

## 2015-07-26 DIAGNOSIS — Z87891 Personal history of nicotine dependence: Secondary | ICD-10-CM | POA: Diagnosis not present

## 2015-07-26 DIAGNOSIS — R030 Elevated blood-pressure reading, without diagnosis of hypertension: Secondary | ICD-10-CM | POA: Diagnosis present

## 2015-07-26 LAB — COMPREHENSIVE METABOLIC PANEL
ALT: 30 U/L (ref 14–54)
ANION GAP: 8 (ref 5–15)
AST: 22 U/L (ref 15–41)
Albumin: 3.6 g/dL (ref 3.5–5.0)
Alkaline Phosphatase: 67 U/L (ref 38–126)
BILIRUBIN TOTAL: 0.4 mg/dL (ref 0.3–1.2)
BUN: 10 mg/dL (ref 6–20)
CHLORIDE: 105 mmol/L (ref 101–111)
CO2: 25 mmol/L (ref 22–32)
Calcium: 10.1 mg/dL (ref 8.9–10.3)
Creatinine, Ser: 0.63 mg/dL (ref 0.44–1.00)
GFR calc Af Amer: >60 mL/min (ref >60)
Glucose, Bld: 88 mg/dL (ref 65–99)
POTASSIUM: 4.1 mmol/L (ref 3.5–5.1)
Sodium: 138 mmol/L (ref 135–145)
TOTAL PROTEIN: 7.2 g/dL (ref 6.5–8.1)

## 2015-07-26 LAB — CBC WITH DIFFERENTIAL/PLATELET
BASOS ABS: 0 10*3/uL (ref 0–0.1)
BASOS PCT: 0 %
EOS ABS: 0 10*3/uL (ref 0–0.7)
EOS PCT: 1 %
HCT: 39.2 % (ref 35.0–47.0)
HEMOGLOBIN: 13.2 g/dL (ref 12.0–16.0)
Lymphocytes Relative: 14 %
Lymphs Abs: 1.3 10*3/uL (ref 1.0–3.6)
MCH: 29.4 pg (ref 26.0–34.0)
MCHC: 33.8 g/dL (ref 32.0–36.0)
MCV: 87 fL (ref 80.0–100.0)
Monocytes Absolute: 0.6 10*3/uL (ref 0.2–0.9)
Monocytes Relative: 6 %
NEUTROS PCT: 79 %
Neutro Abs: 7.7 10*3/uL — ABNORMAL HIGH (ref 1.4–6.5)
PLATELETS: 232 10*3/uL (ref 150–440)
RBC: 4.51 MIL/uL (ref 3.80–5.20)
RDW: 13.3 % (ref 11.5–14.5)
WBC: 9.6 10*3/uL (ref 3.6–11.0)

## 2015-07-26 LAB — PROTEIN / CREATININE RATIO, URINE: CREATININE, URINE: 83 mg/dL

## 2015-07-26 MED ORDER — HYDRALAZINE HCL 20 MG/ML IJ SOLN: 10.0000 mg | Freq: Once | INTRAMUSCULAR | Status: DC | PRN

## 2015-07-26 MED ORDER — LABETALOL HCL 5 MG/ML IV SOLN
20.0000 mg | INTRAVENOUS | Status: DC | PRN
Start: 2015-07-26 — End: 2015-07-26

## 2015-07-26 NOTE — Discharge Summary (Signed)
Patient discharged home, discharge instructions given, patient states understanding. Patient left floor in stable condition, denies any other needs at this time. Patient to keep next scheduled OB appointment. Preeclampsia precautions reviewed.

## 2015-07-26 NOTE — OB Triage Note (Signed)
Pt  Arrived to obs rm 2 for monitoring of preclampsia with gestational DM and HTN. Pt placed on monitors

## 2015-07-26 NOTE — H&P (Signed)
Obstetric History and Physical  Lindsey Bell is a 39 y.o. G3P1010 with Estimated Date of Delivery: 10/15/15 per LMP and 8 wk [place] who presents at [redacted]w[redacted]d for pre-eclampsia evaluation after having elevated BPs at home and at office this am (128-182/76-114 at home, 156/92 at office.) Denies ha, visual changes. Pt has h/o PCOS, CHTN and GDM, she is currently on Metformin 1000 mg BID although she had not taken it for the past few days and Glyburide 2.5 mg QPM. No meds for CHTN. Pt reports the last 2 night having low FSBS. Overnight pt's glucose was 41 and she "didn't feel right". After eating, glucose returned to normal, but she continued to not feel well and proceeded to check BPs as above.  No contractions, no vaginal bleeding, intact membranes, with active fetal movement.    Prenatal Course Source of Care: [facility]  with onset of care at 5 weeks Pregnancy complications or risks: Patient Active Problem List   Diagnosis Date Noted  . Elderly multigravida with antepartum condition or complication 04/26/2015  . Hypertension in pregnancy, antepartum 04/26/2015  . Glucose intolerance of pregnancy  04/26/2015  . History of cesarean delivery 04/26/2015  . History of PCOS 04/26/2015  . Endometriosis determined by laparoscopy 04/26/2015  . Obesity affecting pregnancy in second trimester 04/26/2015     Prenatal Transfer Tool   Past Medical History  Diagnosis Date  . Borderline hypertension   . Gestational diabetes 2016    Past Surgical History  Procedure Laterality Date  . Cesarean section  2005  . Diagnostic laparoscopy  2015    OB History  Gravida Para Term Preterm AB SAB TAB Ectopic Multiple Living  # Outcome Date GA Lbr Len/2nd Weight Sex Delivery Anes PTL Lv  3 Current           2 SAB           1 Term    8 lb 3 oz (3.714 kg)           Social History   Social History  . Marital Status: Married    Spouse Name: N/A  . Number of Children: N/A  . Years of  Education: N/A   Social History Main Topics  . Smoking status: Former Smoker -- 0.50 packs/day for 15 years    Types: Cigarettes    Quit date: 10/21/2011  . Smokeless tobacco: Never Used  . Alcohol Use: No  . Drug Use: No  . Sexual Activity: Yes   Other Topics Concern  . None   Social History Narrative    Family History  Problem Relation Age of Onset  . Hypertension Mother   . Hypertension Father   . Stroke Maternal Grandfather     Prescriptions prior to admission  Medication Sig Dispense Refill Last Dose  . aspirin 81 MG tablet Take 81 mg by mouth daily.   07/26/2015 at Unknown time  . folic acid (FOLVITE) 1 MG tablet Take 1 mg by mouth daily.   07/25/2015 at Unknown time  . ONE TOUCH ULTRA TEST test strip TEST 4 TIMES A DAY EVERY OTHER DAY  0 07/26/2015 at Unknown time  . ONETOUCH DELICA LANCETS 33G MISC See admin instructions.  0 07/26/2015 at Unknown time  . Prenatal Vit-Fe Fumarate-FA (MULTIVITAMIN-PRENATAL) 27-0.8 MG TABS tablet Take 1 tablet by mouth daily at 12 noon.   07/26/2015 aMERSADES Bell  . metFORMIN (GLUCOPHAGE) 500 MG tablet  Take 1,000 mg by mouth 2 (two) times daily.   0 07/23/2015 at Unknown time        Glyburide 2.5 mg QPM   No Known Allergies  Review of Systems: Negative except for what is mentioned in HPI.  Physical Exam: BP 113-141/61-85 mmHg  Pulse 83  Temp(Src) 99.3 F (37.4 C) (Oral)  LMP 01/08/2015 GENERAL: Well-developed, well-nourished female in no acute distress.  ABDOMEN: Soft, nontender, nondistended, gravid. EXTREMITIES: Nontender, no edema Cervical Exam:deferred FHT: Category: 1 for gestational age, baseline 140-150s  Contractions: none   Pertinent Labs/Studies:   Results for orders placed or performed during the hospital encounter of 07/26/15 (from the past 24 hour(s))  Comprehensive metabolic panel     Status: None   Collection Time: 07/26/15 11:18 AM  Result Value Ref Range   Sodium 138 135 - 145 mmol/L   Potassium 4.1 3.5 -  5.1 mmol/L   Chloride 105 101 - 111 mmol/L   CO2 25 22 - 32 mmol/L   Glucose, Bld 88 65 - 99 mg/dL   BUN 10 6 - 20 mg/dL   Creatinine, Ser 5.28 0.44 - 1.00 mg/dL   Calcium 41.3 8.9 - 24.4 mg/dL   Total Protein 7.2 6.5 - 8.1 g/dL   Albumin 3.6 3.5 - 5.0 g/dL   AST 22 15 - 41 U/L   ALT 30 14 - 54 U/L   Alkaline Phosphatase 67 38 - 126 U/L   Total Bilirubin 0.4 0.3 - 1.2 mg/dL   GFR calc non Af Amer >60 >60 mL/min   GFR calc Af Amer >60 >60 mL/min   Anion gap 8 5 - 15  CBC with Differential     Status: Abnormal   Collection Time: 07/26/15 11:18 AM  Result Value Ref Range   WBC 9.6 3.6 - 11.0 K/uL   RBC 4.51 3.80 - 5.20 MIL/uL   Hemoglobin 13.2 12.0 - 16.0 g/dL   HCT 01.0 27.2 - 53.6 %   MCV 87.0 80.0 - 100.0 fL   MCH 29.4 26.0 - 34.0 pg   MCHC 33.8 32.0 - 36.0 g/dL   RDW 64.4 03.4 - 74.2 %   Platelets 232 150 - 440 K/uL   Neutrophils Relative % 79 %   Neutro Abs 7.7 (H) 1.4 - 6.5 K/uL   Lymphocytes Relative 14 %   Lymphs Abs 1.3 1.0 - 3.6 K/uL   Monocytes Relative 6 %   Monocytes Absolute 0.6 0.2 - 0.9 K/uL   Eosinophils Relative 1 %   Eosinophils Absolute 0.0 0 - 0.7 K/uL   Basophils Relative 0 %   Basophils Absolute 0.0 0 - 0.1 K/uL   Awaiting PC Ratio  Assessment : IUP at [redacted]w[redacted]d, CHTN, GDM  Plan: Awaiting PC Ratio  F/u at office scheduled for 10/10 D/t drops in FSBS overnight, advised pt to d/c glucose agents and continue monitoring FSBS, F/u at office for continued plan for glucose management.

## 2015-07-26 NOTE — Discharge Instructions (Signed)
Drink plenty of fluid and get plenty of rest, call your provider for any other concerns °

## 2015-08-02 ENCOUNTER — Ambulatory Visit: Payer: Managed Care, Other (non HMO)

## 2015-08-09 ENCOUNTER — Ambulatory Visit: Payer: Managed Care, Other (non HMO)

## 2015-09-20 ENCOUNTER — Encounter: Payer: Self-pay | Admitting: *Deleted

## 2015-09-20 ENCOUNTER — Observation Stay
Admission: EM | Admit: 2015-09-20 | Discharge: 2015-09-21 | Disposition: A | Discharge status: Home / Self Care | Payer: BLUE CROSS/BLUE SHIELD | Attending: Obstetrics & Gynecology | Admitting: Obstetrics & Gynecology

## 2015-09-20 DIAGNOSIS — M549 Dorsalgia, unspecified: Secondary | ICD-10-CM | POA: Diagnosis present

## 2015-09-20 DIAGNOSIS — O24419 Gestational diabetes mellitus in pregnancy, unspecified control: Secondary | ICD-10-CM

## 2015-09-20 DIAGNOSIS — Z3A36 36 weeks gestation of pregnancy: Secondary | ICD-10-CM | POA: Insufficient documentation

## 2015-09-20 DIAGNOSIS — O09522 Supervision of elderly multigravida, second trimester: Secondary | ICD-10-CM

## 2015-09-20 DIAGNOSIS — O26893 Other specified pregnancy related conditions, third trimester: Secondary | ICD-10-CM | POA: Insufficient documentation

## 2015-09-20 DIAGNOSIS — O162 Unspecified maternal hypertension, second trimester: Secondary | ICD-10-CM

## 2015-09-20 LAB — URINALYSIS COMPLETE WITH MICROSCOPIC (ARMC ONLY)
Bilirubin Urine: NEGATIVE
Glucose, UA: NEGATIVE mg/dL
Hgb urine dipstick: NEGATIVE
Ketones, ur: NEGATIVE mg/dL
Leukocytes, UA: NEGATIVE
Nitrite: NEGATIVE
PROTEIN: NEGATIVE mg/dL
RBC / HPF: NONE SEEN RBC/hpf (ref 0–5)
Specific Gravity, Urine: 1.002 — ABNORMAL LOW (ref 1.005–1.030)
pH: 6 (ref 5.0–8.0)

## 2015-09-20 MED ORDER — ACETAMINOPHEN 325 MG PO TABS: 650.0000 mg | ORAL_TABLET | ORAL | Status: DC | PRN

## 2015-09-21 ENCOUNTER — Encounter: Payer: Self-pay | Admitting: Obstetrics & Gynecology

## 2015-09-21 NOTE — Discharge Summary (Signed)
Lindsey RandyJamie S Bell is a 39 y.o. female. She is at 5232w4d gestation.  Indication: contractions, back pain  S: Resting comfortably. occasional CTX, no VB. Active fetal movement. Concerned about preterm labor - having significant back pain and tightening of the uterus about 5-10 mins apart.  She did not have intercourse recently, but did have watery diarrhea this morning x1. O:  BP 118/88 mmHg  Pulse 96  Temp(Src) 98.3 F (36.8 C) (Oral)  LMP 01/08/2015   Results for orders placed or performed during the hospital encounter of 09/20/15 (from the past 48 hour(s))  Urinalysis complete, with microscopic [facility] only)   Collection Time: 09/20/15 11:04 PM  Result Value Ref Range   Color, Urine STRAW (A) YELLOW   APPearance CLEAR (A) CLEAR   Glucose, UA NEGATIVE NEGATIVE mg/dL   Bilirubin Urine NEGATIVE NEGATIVE   Ketones, ur NEGATIVE NEGATIVE mg/dL   Specific Gravity, Urine 1.002 (L) 1.005 - 1.030   Hgb urine dipstick NEGATIVE NEGATIVE   pH 6.0 5.0 - 8.0   Protein, ur NEGATIVE NEGATIVE mg/dL   Nitrite NEGATIVE NEGATIVE   Leukocytes, UA NEGATIVE NEGATIVE   RBC / HPF NONE SEEN 0 - 5 RBC/hpf   WBC, UA 0-5 0 - 5 WBC/hpf   Bacteria, UA RARE (A) NONE SEEN   Squamous Epithelial / LPF 0-5 (A) NONE SEEN     Gen: NAD, AAOx3      Abd: FNTTP      Ext: Non-tender, Nonedmeatous    FHT: 135 mod + accels no decels TOCO: occasional then infrequent ctx SVE: closed/long/high posterior, firm - patient uncomfortable with exam   A/P:  39yo G3P1011.   Labor: not present.   Back pain: likely related to contractions, but resolving with heating pad   Fetal Wellbeing: Reassuring Cat 1 tracing.  D/c home stable, precautions reviewed, follow-up as scheduled.   Graydon Fofana, [name]

## 2015-09-27 ENCOUNTER — Observation Stay
Admission: EM | Admit: 2015-09-27 | Discharge: 2015-09-28 | Disposition: A | Discharge status: Home / Self Care | Payer: BLUE CROSS/BLUE SHIELD | Attending: Obstetrics & Gynecology | Admitting: Obstetrics & Gynecology

## 2015-09-27 DIAGNOSIS — G4700 Insomnia, unspecified: Secondary | ICD-10-CM | POA: Diagnosis not present

## 2015-09-27 DIAGNOSIS — Z3A37 37 weeks gestation of pregnancy: Secondary | ICD-10-CM | POA: Insufficient documentation

## 2015-09-27 DIAGNOSIS — O26893 Other specified pregnancy related conditions, third trimester: Secondary | ICD-10-CM | POA: Diagnosis not present

## 2015-09-27 DIAGNOSIS — R51 Headache: Secondary | ICD-10-CM | POA: Diagnosis present

## 2015-09-27 MED ORDER — ACETAMINOPHEN 500 MG PO TABS: 1000.0000 mg | ORAL_TABLET | ORAL | Status: DC | PRN

## 2015-09-27 MED ORDER — METOCLOPRAMIDE HCL 5 MG/ML IJ SOLN
10.0000 mg | Freq: Four times a day (QID) | INTRAMUSCULAR | Status: DC | PRN
Start: 2015-09-27 — End: 2015-09-28
  Administered 2015-09-27: 10 mg via INTRAVENOUS

## 2015-09-27 MED ORDER — LORAZEPAM 0.5 MG PO TABS
1.0000 mg | ORAL_TABLET | Freq: Every day | ORAL | Status: DC
  Administered 2015-09-27: 1 mg via ORAL
  Filled 2015-09-27: qty 2

## 2015-09-27 MED ORDER — LACTATED RINGERS IV SOLN
INTRAVENOUS | Status: DC
  Administered 2015-09-27: via INTRAVENOUS

## 2015-09-27 MED ORDER — LACTATED RINGERS IV BOLUS (SEPSIS)
1000.0000 mL | Freq: Once | INTRAVENOUS | Status: AC
  Administered 2015-09-27: 1000 mL via INTRAVENOUS

## 2015-09-27 NOTE — OB Triage Note (Signed)
Pt states she has a headache that started this morning. Has taken some tylenol prn throughout the day with no relief. Called and spoke to dr ward. Was told to take benadryl 25mg  po which pt states she took before coming. in

## 2015-09-27 NOTE — Plan of Care (Signed)
Ativan 1 mg po given. Will turn lights out and suggested pt turn off tv and try not to use phone

## 2015-09-28 ENCOUNTER — Encounter: Payer: Self-pay | Admitting: Obstetrics & Gynecology

## 2015-09-28 DIAGNOSIS — O26893 Other specified pregnancy related conditions, third trimester: Secondary | ICD-10-CM | POA: Diagnosis not present

## 2015-09-28 NOTE — Discharge Summary (Signed)
Lindsey RandyJamie S Bell is a 39 y.o. female. She is at 3034w4d gestation.  Indication: headache  S: Resting comfortably. no CTX, no VB. Active fetal movement. Concerned about a severe headache, with no relief with tylenol, caffeine, or benadryl.  She has a history of chronic HTN, currently controlled without meds.  She describes her HA as in the back of her head and in neck and shoulders.  She says she hasn't been able to sleep in months, and thinks it is finally catching up with her.  Denies change vision, neck pain, fever/chills, sinus pain. O:  BP 123/80 mmHg  Pulse 87  LMP 01/08/2015 No results found for this or any previous visit (from the past 48 hour(s)).   Gen: NAD, AAOx3      Abd: FNTTP      Ext: Non-tender, Nonedmeatous    FHT: 120 mod + accels no decels TOCO: quiet SVE: deferred   A/P:   39yo G3P1011 @ 37.4 with headache and insomnia   Labor: not present.   HA: treated and resolved after fluid bolus, 10mg  reglan and 1mg  ativan.  Patient did not sleep at all during stay.    Fetal Wellbeing: Reassuring Cat 1 tracing.  D/c home stable, precautions reviewed, follow-up as scheduled.   Patient asked for note to be removed from work, due to insomnia and inability to function well at work due to exhaustion.  This had previously been discussed with Dr. Tiburcio PeaHarris.    [name], [name]

## 2015-09-28 NOTE — Progress Notes (Signed)
Pt states she is not able to sleep. Dr ward notified. Pt wants to talk to dr. Dr ward notified. Will be out to speak to pt shortly

## 2015-10-09 ENCOUNTER — Encounter
Admission: RE | Admit: 2015-10-09 | Discharge: 2015-10-09 | Disposition: A | Discharge status: Home / Self Care | Payer: BLUE CROSS/BLUE SHIELD | Source: Ambulatory Visit | Attending: Obstetrics and Gynecology | Admitting: Obstetrics and Gynecology

## 2015-10-09 LAB — CBC
HCT: 40.3 % (ref 35.0–47.0)
HEMOGLOBIN: 13.6 g/dL (ref 12.0–16.0)
MCH: 29.3 pg (ref 26.0–34.0)
MCHC: 33.8 g/dL (ref 32.0–36.0)
MCV: 86.8 fL (ref 80.0–100.0)
PLATELETS: 199 10*3/uL (ref 150–440)
RBC: 4.65 MIL/uL (ref 3.80–5.20)
RDW: 13.3 % (ref 11.5–14.5)
WBC: 10.1 10*3/uL (ref 3.6–11.0)

## 2015-10-10 ENCOUNTER — Inpatient Hospital Stay: Payer: BLUE CROSS/BLUE SHIELD | Admitting: Anesthesiology

## 2015-10-10 ENCOUNTER — Encounter: Payer: Self-pay | Admitting: Anesthesiology

## 2015-10-10 ENCOUNTER — Inpatient Hospital Stay
Admission: RE | Admit: 2015-10-10 | Discharge: 2015-10-12 | DRG: 765 | Disposition: A | Discharge status: Home / Self Care | Payer: BLUE CROSS/BLUE SHIELD | Attending: Obstetrics and Gynecology | Admitting: Obstetrics and Gynecology

## 2015-10-10 ENCOUNTER — Inpatient Hospital Stay
Admission: RE | Admit: 2015-10-10 | Payer: BLUE CROSS/BLUE SHIELD | Source: Ambulatory Visit | Admitting: Obstetrics and Gynecology

## 2015-10-10 ENCOUNTER — Encounter
Admission: RE | Disposition: A | Discharge status: Home / Self Care | Payer: Self-pay | Source: Home / Self Care | Attending: Obstetrics and Gynecology

## 2015-10-10 DIAGNOSIS — E282 Polycystic ovarian syndrome: Secondary | ICD-10-CM | POA: Diagnosis present

## 2015-10-10 DIAGNOSIS — O162 Unspecified maternal hypertension, second trimester: Secondary | ICD-10-CM

## 2015-10-10 DIAGNOSIS — E669 Obesity, unspecified: Secondary | ICD-10-CM | POA: Diagnosis present

## 2015-10-10 DIAGNOSIS — Z6841 Body Mass Index (BMI) 40.0 and over, adult: Secondary | ICD-10-CM

## 2015-10-10 DIAGNOSIS — O34211 Maternal care for low transverse scar from previous cesarean delivery: Secondary | ICD-10-CM | POA: Diagnosis present

## 2015-10-10 DIAGNOSIS — Z3A39 39 weeks gestation of pregnancy: Secondary | ICD-10-CM

## 2015-10-10 DIAGNOSIS — O24429 Gestational diabetes mellitus in childbirth, unspecified control: Secondary | ICD-10-CM | POA: Diagnosis present

## 2015-10-10 DIAGNOSIS — O99214 Obesity complicating childbirth: Secondary | ICD-10-CM | POA: Diagnosis present

## 2015-10-10 DIAGNOSIS — O321XX0 Maternal care for breech presentation, not applicable or unspecified: Secondary | ICD-10-CM | POA: Diagnosis present

## 2015-10-10 DIAGNOSIS — O9081 Anemia of the puerperium: Secondary | ICD-10-CM | POA: Diagnosis not present

## 2015-10-10 DIAGNOSIS — D62 Acute posthemorrhagic anemia: Secondary | ICD-10-CM | POA: Diagnosis not present

## 2015-10-10 DIAGNOSIS — O24419 Gestational diabetes mellitus in pregnancy, unspecified control: Secondary | ICD-10-CM

## 2015-10-10 DIAGNOSIS — O1092 Unspecified pre-existing hypertension complicating childbirth: Secondary | ICD-10-CM | POA: Diagnosis present

## 2015-10-10 DIAGNOSIS — O09522 Supervision of elderly multigravida, second trimester: Secondary | ICD-10-CM

## 2015-10-10 DIAGNOSIS — O09523 Supervision of elderly multigravida, third trimester: Secondary | ICD-10-CM

## 2015-10-10 LAB — HIV ANTIBODY (ROUTINE TESTING W REFLEX): HIV SCREEN 4TH GENERATION: NONREACTIVE

## 2015-10-10 LAB — ABO/RH: ABO/RH(D): A POS

## 2015-10-10 LAB — RPR: RPR Ser Ql: NONREACTIVE

## 2015-10-10 SURGERY — Surgical Case
Anesthesia: Spinal | Site: Abdomen | Wound class: Clean Contaminated

## 2015-10-10 MED ORDER — LANOLIN HYDROUS EX OINT: 1.0000 "application " | TOPICAL_OINTMENT | CUTANEOUS | Status: DC | PRN

## 2015-10-10 MED ORDER — OXYTOCIN 40 UNITS IN LACTATED RINGERS INFUSION - SIMPLE MED
62.5000 mL/h | INTRAVENOUS | Status: DC
  Administered 2015-10-10: 40 [IU] via INTRAVENOUS
  Filled 2015-10-10: qty 1000

## 2015-10-10 MED ORDER — PRENATAL MULTIVITAMIN CH
1.0000 | ORAL_TABLET | Freq: Every day | ORAL | Status: DC
Start: 2015-10-11 — End: 2015-10-12
  Administered 2015-10-11: 1 via ORAL
  Filled 2015-10-10: qty 1

## 2015-10-10 MED ORDER — CEFAZOLIN SODIUM-DEXTROSE 2-3 GM-% IV SOLR
2.0000 g | INTRAVENOUS | Status: AC
  Administered 2015-10-10: 2 g via INTRAVENOUS
  Filled 2015-10-10: qty 50

## 2015-10-10 MED ORDER — BUPIVACAINE 0.25 % ON-Q PUMP DUAL CATH 400 ML
400.0000 mL | INJECTION | Status: DC
  Filled 2015-10-10: qty 400

## 2015-10-10 MED ORDER — LACTATED RINGERS IV SOLN
INTRAVENOUS | Status: DC
  Administered 2015-10-10: 15:00:00 via INTRAVENOUS

## 2015-10-10 MED ORDER — PHENYLEPHRINE HCL 10 MG/ML IJ SOLN
INTRAMUSCULAR | Status: DC | PRN
  Administered 2015-10-10 (×2): 200 ug via INTRAVENOUS

## 2015-10-10 MED ORDER — BUPIVACAINE HCL 0.5 % IJ SOLN
10.0000 mL | Freq: Once | INTRAMUSCULAR | Status: DC
  Filled 2015-10-10: qty 10

## 2015-10-10 MED ORDER — DIBUCAINE 1 % RE OINT: 1.0000 "application " | TOPICAL_OINTMENT | RECTAL | Status: DC | PRN

## 2015-10-10 MED ORDER — MORPHINE SULFATE (PF) 0.5 MG/ML IJ SOLN
INTRAMUSCULAR | Status: DC | PRN
  Administered 2015-10-10: .2 mg via EPIDURAL

## 2015-10-10 MED ORDER — FENTANYL CITRATE (PF) 100 MCG/2ML IJ SOLN
INTRAMUSCULAR | Status: DC | PRN
  Administered 2015-10-10: 20 ug via INTRAVENOUS

## 2015-10-10 MED ORDER — SIMETHICONE 80 MG PO CHEW
80.0000 mg | CHEWABLE_TABLET | Freq: Three times a day (TID) | ORAL | Status: DC
  Administered 2015-10-11 (×3): 80 mg via ORAL
  Filled 2015-10-10 (×4): qty 1

## 2015-10-10 MED ORDER — ACETAMINOPHEN 325 MG PO TABS: 650.0000 mg | ORAL_TABLET | ORAL | Status: DC | PRN

## 2015-10-10 MED ORDER — BUPIVACAINE IN DEXTROSE 0.75-8.25 % IT SOLN
INTRATHECAL | Status: DC | PRN
  Administered 2015-10-10: 1.6 mL via INTRATHECAL

## 2015-10-10 MED ORDER — BUPIVACAINE HCL 0.5 % IJ SOLN
INTRAMUSCULAR | Status: DC | PRN
  Administered 2015-10-10: 10 mL

## 2015-10-10 MED ORDER — SIMETHICONE 80 MG PO CHEW
80.0000 mg | CHEWABLE_TABLET | ORAL | Status: DC
Start: 2015-10-11 — End: 2015-10-12
  Administered 2015-10-12: 80 mg via ORAL

## 2015-10-10 MED ORDER — DIPHENHYDRAMINE HCL 25 MG PO CAPS
25.0000 mg | ORAL_CAPSULE | Freq: Four times a day (QID) | ORAL | Status: DC | PRN
  Administered 2015-10-11: 25 mg via ORAL
  Filled 2015-10-10: qty 1

## 2015-10-10 MED ORDER — OXYTOCIN 10 UNIT/ML IJ SOLN
40.0000 [IU] | INTRAVENOUS | Status: DC | PRN
  Administered 2015-10-10: 40 [IU] via INTRAVENOUS

## 2015-10-10 MED ORDER — PHENYLEPHRINE HCL 10 MG/ML IJ SOLN
10000.0000 ug | INTRAMUSCULAR | Status: DC | PRN
  Administered 2015-10-10: 40 ug/min via INTRAVENOUS

## 2015-10-10 MED ORDER — LACTATED RINGERS IV SOLN
INTRAVENOUS | Status: DC
  Administered 2015-10-11: 05:00:00 via INTRAVENOUS

## 2015-10-10 MED ORDER — IBUPROFEN 600 MG PO TABS
600.0000 mg | ORAL_TABLET | Freq: Four times a day (QID) | ORAL | Status: DC
  Administered 2015-10-11: 600 mg via ORAL
  Filled 2015-10-10: qty 1

## 2015-10-10 MED ORDER — OXYTOCIN 40 UNITS IN LACTATED RINGERS INFUSION - SIMPLE MED
INTRAVENOUS | Status: AC
  Administered 2015-10-10: 40 [IU] via INTRAVENOUS
  Filled 2015-10-10: qty 1000

## 2015-10-10 MED ORDER — WITCH HAZEL-GLYCERIN EX PADS: 1.0000 "application " | MEDICATED_PAD | CUTANEOUS | Status: DC | PRN

## 2015-10-10 MED ORDER — ONDANSETRON HCL 4 MG/2ML IJ SOLN
INTRAMUSCULAR | Status: DC | PRN
  Administered 2015-10-10: 8 mg via INTRAVENOUS

## 2015-10-10 MED ORDER — BUPIVACAINE HCL (PF) 0.5 % IJ SOLN
INTRAMUSCULAR | Status: AC
  Filled 2015-10-10: qty 30

## 2015-10-10 MED ORDER — CITRIC ACID-SODIUM CITRATE 334-500 MG/5ML PO SOLN
30.0000 mL | ORAL | Status: AC
  Administered 2015-10-10: 30 mL via ORAL
  Filled 2015-10-10: qty 15

## 2015-10-10 MED ORDER — SIMETHICONE 80 MG PO CHEW: 80.0000 mg | CHEWABLE_TABLET | ORAL | Status: DC | PRN

## 2015-10-10 MED ORDER — MENTHOL 3 MG MT LOZG: 1.0000 | LOZENGE | OROMUCOSAL | Status: DC | PRN

## 2015-10-10 MED ORDER — SENNOSIDES-DOCUSATE SODIUM 8.6-50 MG PO TABS: 2.0000 | ORAL_TABLET | ORAL | Status: DC

## 2015-10-10 SURGICAL SUPPLY — 34 items
BAG COUNTER SPONGE EZ (MISCELLANEOUS) ×2 IMPLANT
CANISTER SUCT 3000ML (MISCELLANEOUS) ×3 IMPLANT
CATH KIT ON-Q SILVERSOAK 5IN (CATHETERS) ×6 IMPLANT
CHLORAPREP W/TINT 26ML (MISCELLANEOUS) ×6 IMPLANT
CLOSURE WOUND 1/2 X4 (GAUZE/BANDAGES/DRESSINGS)
COUNTER SPONGE BAG EZ (MISCELLANEOUS) ×1
DRSG TELFA 3X8 NADH (GAUZE/BANDAGES/DRESSINGS) ×3 IMPLANT
ELECT CAUTERY BLADE 6.4 (BLADE) ×3 IMPLANT
GAUZE SPONGE 4X4 12PLY STRL (GAUZE/BANDAGES/DRESSINGS) ×6 IMPLANT
GLOVE BIO SURGEON STRL SZ7 (GLOVE) ×3 IMPLANT
GLOVE BIOGEL PI IND STRL 7.0 (GLOVE) ×1 IMPLANT
GLOVE BIOGEL PI INDICATOR 7.0 (GLOVE) ×2
GLOVE INDICATOR 7.5 STRL GRN (GLOVE) ×3 IMPLANT
GLOVE SKINSENSE NS SZ6.5 (GLOVE) ×2
GLOVE SKINSENSE STRL SZ6.5 (GLOVE) ×1 IMPLANT
GOWN STRL REUS W/ TWL LRG LVL3 (GOWN DISPOSABLE) ×3 IMPLANT
GOWN STRL REUS W/TWL LRG LVL3 (GOWN DISPOSABLE) ×6
LIQUID BAND (GAUZE/BANDAGES/DRESSINGS) ×6 IMPLANT
NS IRRIG 1000ML POUR BTL (IV SOLUTION) ×3 IMPLANT
PACK C SECTION AR (MISCELLANEOUS) ×3 IMPLANT
PAD GROUND ADULT SPLIT (MISCELLANEOUS) ×3 IMPLANT
PAD OB MATERNITY 4.3X12.25 (PERSONAL CARE ITEMS) ×3 IMPLANT
PAD PREP 24X41 OB/GYN DISP (PERSONAL CARE ITEMS) ×3 IMPLANT
RTRCTR C-SECT PINK 34CM XLRG (MISCELLANEOUS) ×3 IMPLANT
STAPLER INSORB 30 2030 C-SECTI (MISCELLANEOUS) ×3 IMPLANT
STRIP CLOSURE SKIN 1/2X4 (GAUZE/BANDAGES/DRESSINGS) IMPLANT
SUT MNCRL AB 4-0 PS2 18 (SUTURE) ×3 IMPLANT
SUT PDS AB 1 TP1 96 (SUTURE) ×6 IMPLANT
SUT PLAIN 2 0 XLH (SUTURE) ×3 IMPLANT
SUT PLAIN GUT 2-0 30 C14 SG823 (SUTURE) ×3
SUT VIC AB 0 CTX 36 (SUTURE) ×4
SUT VIC AB 0 CTX36XBRD ANBCTRL (SUTURE) ×2 IMPLANT
SUT VIC AB 2-0 CT1 36 (SUTURE) ×3 IMPLANT
SUTURE PLN GUT2-0 30 C14 SG823 (SUTURE) ×1 IMPLANT

## 2015-10-10 NOTE — H&P (Signed)
Date of Initial paper H&P: 10/09/2015  History reviewed, patient examined, no change in status, stable for surgery.

## 2015-10-10 NOTE — Discharge Summary (Signed)
Obstetric Discharge Summary Reason for Admission: cesarean section Prenatal Procedures: NST Intrapartum Procedures: repeat LTCS with BTL Lindsey Abu([place]) Postpartum Procedures: none Complications-Operative and Postpartum: none HEMOGLOBIN  Date Value Ref Range Status  10/11/2015 11.8* 12.0 - 16.0 g/dL Final   HGB  Date Value Ref Range Status  06/06/2014 13.5 12.0-16.0 g/dL Final   HCT  Date Value Ref Range Status  10/11/2015 35.6 35.0 - 47.0 % Final  05/16/2014 39.8 35.0-47.0 % Final    Physical Exam: negative; on q in place and c/d/i incision line  Discharge Diagnoses: Term Pregnancy-delivered  Discharge Information: Date: 10/12/2015 Activity: pelvic rest and no lifting greater than 10lbs for 6 weeks, no driving for 2 weeks Diet: routine   Medication List    STOP taking these medications        aspirin 81 MG tablet     folic acid 1 MG tablet  Commonly known as:  FOLVITE      TAKE these medications        ibuprofen 600 MG tablet  Commonly known as:  ADVIL,MOTRIN  Take 1 tablet (600 mg total) by mouth every 6 (six) hours as needed for headache, mild pain or cramping.     multivitamin-prenatal 27-0.8 MG Tabs tablet  Take 1 tablet by mouth daily at 12 noon.     oxyCODONE-acetaminophen 5-325 MG tablet  Commonly known as:  ROXICET  Take 1 tablet by mouth every 6 (six) hours as needed for severe pain.      ASK your doctor about these medications        ONE TOUCH ULTRA TEST test strip  Generic drug:  glucose blood  Reported on 10/10/2015     [facility] DEL[facility]ANC[facility] 33G Misc  See admin instructions. Reported on 10/10/2015        Condition: stable Discharge to: home Follow-up Information    Follow up with Lindsey Bell, Lindsey M, MD. Call in 1 week.   Specialty:  Obstetrics and Gynecology   Why:  For wound re-check   Contact information:   [address] [place] [id number] [phone]        Home with mother.  Lindsey Bell 10/12/2015, 9:09  AM

## 2015-10-10 NOTE — Transfer of Care (Signed)
Immediate Anesthesia Transfer of Care Note  Patient: Lindsey Bell  Procedure(s) Performed: Procedure(s): CESAREAN SECTION (N/A)  Patient Location: PACU  Anesthesia Type:General  Level of Consciousness: sedated  Airway & Oxygen Therapy: Patient Spontanous Breathing and Patient connected to face mask oxygen  Post-op Assessment: Report given to RN and Post -op Vital signs reviewed and stable  Post vital signs: Reviewed and stable  Last Vitals:  Filed Vitals:   10/10/15 1323 10/10/15 1645  BP: 135/9 105/49  Pulse:  97  Temp: 36.8 C 36.9 C    Complications: No apparent anesthesia complications

## 2015-10-10 NOTE — Anesthesia Procedure Notes (Signed)
Spinal Patient location during procedure: OB Staffing Anesthesiologist: Katy Fitch K Performed by: anesthesiologist  Preanesthetic Checklist Completed: patient identified, site marked, surgical consent, pre-op evaluation, timeout performed, IV checked, risks and benefits discussed and monitors and equipment checked Spinal Block Patient position: sitting Prep: Betadine Patient monitoring: heart rate, continuous pulse ox, blood pressure and cardiac monitor Approach: midline Location: L4-5 Injection technique: single-shot Needle Needle type: Whitacre and Introducer  Needle gauge: 25 G Needle length: 12.7 cm Needle insertion depth: 12 cm Assessment Sensory level: T6 Additional Notes Negative paresthesia. Negative blood return. Positive free-flowing CSF. Expiration date of kit checked and confirmed. Patient tolerated procedure well, without complications. Tolerated well and vsst. Lindsey Bell

## 2015-10-10 NOTE — Op Note (Signed)
Preoperative Diagnosis: 1) 39 y.o. G3P2010 at 2847w2d 2) Desires permanent surgical sterilization 3) GDMA2 4) Breech 5) Prior cesarean section 6) Obesity BMI 47  Postoperative Diagnosis: 1) 39 y.o. G3P2010 at 7747w2d 2) Desires permanent surgical sterilization 3) GDMA2 4) Breech 5) Prior cesarean section 6) Obesity BMI 47  Operation Performed: Repeat low transverse C-section via pfannenstiel skin incision and [place] tubal ligation  Anesthesia: Spinal  Primary Surgeon: Vena AustriaAndreas Zyren Sevigny, MD  Assistant: Dalene Carrowolleen Guitierez, CNM  Preoperative Antibiotics: 2g Ancef  Estimated Blood Loss: 700mL  IV Fluids: 500mL  Urine Output:: pending  Drains or Tubes: Foley to gravity drainage, ON-Q catheter system  Implants: none  Specimens Removed: none  Complications: none  Intraoperative Findings:  Normal tubes ovaries and uterus.  Delivery resulted in the birth of a liveborn female,  APGAR (1 MIN):   APGAR (5 MINS):   weight 7lbs 11oz, 3490g  Patient Condition: stable  Procedure in Detail:  Patient was taken to the operating room were she was administered regional anesthesia.  She was positioned in the supine position, prepped and draped in the  Usual sterile fashion.  Prior to proceeding with the case a time out was performed and the level of anesthetic was checked and noted to be adequate.  Utilizing the scalpel a pfannenstiel skin incision was made 2cm above the pubic symphysis utilizing the patient's pre-existing scar and carried down sharply to the the level of the rectus fascia.  The fascia was incised in the midline using the scalpel and then extended using mayo scissors.  The superior border of the rectus fascia was grasped with two Kocher clamps and the underlying rectus muscles were dissected of the fascia using blunt dissection.  The median raphae was incised using Mayo scissors.   The inferior border of the rectus fascia was dissected of the rectus muscles in a similar  fashion.  The midline was identified, the peritoneum was entered bluntly and expanded using manual tractions.  The uterus was noted to be in a none rotated position.  An XL Alexis retractor was placed to facilitate visualization of the lower uterine segment.  A bladder flap was not created.  A low transverse incision was scored on the lower uterine segment.  The hysterotomy was entered bluntly using the operators finger.  The hysterotomy incision was extended using manual traction.  The operators hand was placed within the hysterotomy position noting the fetus to be within the frank breech position.  Fetal buttock was brought to the incision, delivered to the level of the scapula.  The right arm was splinted across the infants chest and delivered.  The body was rotated 180 degrees and the left arm was delivered in a similar fashion.  The head was flexed and delivered with fundal pressure.  The remainder of the body delivered with ease.  The ord was clamped and cut before handing off to the awaiting neonatologist.  The placenta was delivered using manual extraction.  The uterus was exteriorized, wiped clean of clots and debris using two moist laps.  he uterus was returned to the abdomen. The hysterotomy was closed using a two layer closure of 0 Vicryl, with the first being a running locked, the second a vertical imbricating.  The right tuba was grasped in a mid isthmic portion using a Babcock clamp, before being double suture ligated using a 0 chromic wheel.  The intervening nuckel of tube was excised using Metzenbaum scissors.  Complete cross section of tubal ostia visualized, and noted to  be hemostatic  This procedure was then repeated in similar fashion for the patient left tube.    The peritoneal gutters were wiped clean of clots and debris using two moist laps.  The hysterotomy incision and tubal pedicles were re-inspected noted to be hemostatic.  The rectus muscles were re-approximated in the midline using a  single 2-0 Vicryl mattress stitch.  The rectus muscles were inspected noted to be hemostatic.  The superior border of the rectus fascia was grasped with a Kocher clamp.  The ON-Q trocars were then placed 4cm above the superior border of the incision and tunneled subfascially.  The introducers were removed and the catheters were threaded through the sleeves after which the sleeves were removed.  The fascia was closed using a looped #1 PDS in a running fashion taking 1cm by 1cm bites.  The subcutaneous tissue was irrigated using warm saline, hemostasis achieved using the bovis.  The subcutaneous dead space was greater than 3cm and was closed. The subcutaneous dead space was obliterated by using a 53-T 0 Chromic in a running fashion.   The skin was closed using insorb staples.  Sponge needle and instrument counts were corrects times two.  The patient tolerated the procedure well and was taken to the recovery room in stable condition.

## 2015-10-10 NOTE — Anesthesia Preprocedure Evaluation (Signed)
Anesthesia Evaluation  Patient identified by MRN, date of birth, ID band Patient awake    Reviewed: Allergy & Precautions, NPO status , Patient's Chart, lab work & pertinent test results  Airway Mallampati: II  TM Distance: >3 FB     Dental  (+) Chipped   Pulmonary former smoker,           Cardiovascular hypertension, Pt. on medications Normal cardiovascular exam     Neuro/Psych negative neurological ROS  negative psych ROS   GI/Hepatic negative GI ROS, Neg liver ROS,   Endo/Other  diabetes, GestationalGestational hyperglycemia  Renal/GU negative Renal ROS  negative genitourinary   Musculoskeletal   Abdominal Normal abdominal exam  (+) + obese,   Peds negative pediatric ROS (+)  Hematology negative hematology ROS (+)   Anesthesia Other Findings   Reproductive/Obstetrics                             Anesthesia Physical Anesthesia Plan  ASA: II  Anesthesia Plan: Spinal   Post-op Pain Management:    Induction: Intravenous  Airway Management Planned: Nasal Cannula  Additional Equipment:   Intra-op Plan:   Post-operative Plan:   Informed Consent: I have reviewed the patients History and Physical, chart, labs and discussed the procedure including the risks, benefits and alternatives for the proposed anesthesia with the patient or authorized representative who has indicated his/her understanding and acceptance.   Dental advisory given  Plan Discussed with: CRNA and Surgeon  Anesthesia Plan Comments:         Anesthesia Quick Evaluation

## 2015-10-11 ENCOUNTER — Encounter: Payer: Self-pay | Admitting: Obstetrics and Gynecology

## 2015-10-11 LAB — CBC
HEMATOCRIT: 35.6 % (ref 35.0–47.0)
Hemoglobin: 11.8 g/dL — ABNORMAL LOW (ref 12.0–16.0)
MCH: 29.3 pg (ref 26.0–34.0)
MCHC: 33.3 g/dL (ref 32.0–36.0)
MCV: 87.8 fL (ref 80.0–100.0)
Platelets: 173 10*3/uL (ref 150–440)
RBC: 4.05 MIL/uL (ref 3.80–5.20)
RDW: 13.3 % (ref 11.5–14.5)
WBC: 10.9 10*3/uL (ref 3.6–11.0)

## 2015-10-11 LAB — TYPE AND SCREEN
ABO/RH(D): A POS
ANTIBODY SCREEN: NEGATIVE
EXTEND SAMPLE REASON: UNDETERMINED

## 2015-10-11 MED ORDER — IBUPROFEN 600 MG PO TABS
600.0000 mg | ORAL_TABLET | Freq: Four times a day (QID) | ORAL | Status: DC
  Administered 2015-10-11 – 2015-10-12 (×5): 600 mg via ORAL
  Filled 2015-10-11 (×5): qty 1

## 2015-10-11 MED ORDER — LACTATED RINGERS IV BOLUS (SEPSIS)
500.0000 mL | Freq: Once | INTRAVENOUS | Status: AC
  Administered 2015-10-11: 500 mL via INTRAVENOUS

## 2015-10-11 NOTE — Anesthesia Postprocedure Evaluation (Signed)
Anesthesia Post Note  Patient: Lindsey Bell  Procedure(s) Performed: Procedure(s) (LRB): CESAREAN SECTION (N/A)  Patient location during evaluation: Mother Baby Anesthesia Type: Spinal Level of consciousness: awake and alert Pain management: pain level controlled Vital Signs Assessment: post-procedure vital signs reviewed and stable Respiratory status: spontaneous breathing and respiratory function stable Cardiovascular status: blood pressure returned to baseline and stable Postop Assessment: spinal receding Anesthetic complications: no    Last Vitals:  Filed Vitals:   10/11/15 0021 10/11/15 0502  BP: 92/56 120/68  Pulse: 84 89  Temp: 36.5 C 36.8 C  Resp: 20 20    Last Pain:  Filed Vitals:   10/11/15 0501  PainSc: 2                  [name],  Moesha Sarchet A

## 2015-10-11 NOTE — Anesthesia Post-op Follow-up Note (Signed)
  Anesthesia Pain Follow-up Note  Patient: Lindsey Bell  Day #: 1  Date of Follow-up: 10/11/2015 Time: 7:20 AM  Last Vitals:  Filed Vitals:   10/11/15 0021 10/11/15 0502  BP: 92/56 120/68  Pulse: 84 89  Temp: 36.5 C 36.8 C  Resp: 20 20    Level of Consciousness: alert  Pain: 0 /10   Side Effects:Pruritis  Catheter Site Exam:clean, dry, no drainage  Plan: D/C from anesthesia care  [name],  [name]

## 2015-10-11 NOTE — Progress Notes (Signed)
  Subjective:  Doing well, pain adequately controlled has not required narcotics.  Minimal lochia.  No fevers, chills   Objective:   Filed Vitals:   10/10/15 2230 10/11/15 0021 10/11/15 0502 10/11/15 0747  BP: 91/57 92/56 120/68 109/63  Pulse: 96 84 89 79  Temp: 97.9 F (36.6 C) 97.7 F (36.5 C) 98.3 F (36.8 C) 98.4 F (36.9 C)  TempSrc: Oral Oral Oral Oral  Resp: 20 20 20 18   Height:      Weight:      SpO2: 99% 100% 98%    Some mild range BP's yesterday  General: NAD Pulmonary: no increased work of breathing Abdomen: non-distended, non-tender, fundus firm at level of umbilicus Incision: D/C/I dressing Extremities: no edema, no erythema, no tenderness  Results for orders placed or performed during the hospital encounter of 10/10/15 (from the past 72 hour(s))  CBC     Status: Abnormal   Collection Time: 10/11/15  6:17 AM  Result Value Ref Range   WBC 10.9 3.6 - 11.0 K/uL   RBC 4.05 3.80 - 5.20 MIL/uL   Hemoglobin 11.8 (L) 12.0 - 16.0 g/dL   HCT 40.935.6 81.135.0 - 91.447.0 %   MCV 87.8 80.0 - 100.0 fL   MCH 29.3 26.0 - 34.0 pg   MCHC 33.3 32.0 - 36.0 g/dL   RDW 78.213.3 95.611.5 - 21.314.5 %   Platelets 173 150 - 440 K/uL     Assessment:   39 y.o. G3P2010 postoperativeday #1 RLTCS & BTL   Plan:  1) Acute blood loss anemia - hemodynamically stable and asymptomatic - po ferrous sulfate  2) A pos / RI / VZI   3) TDAP status up to date  4) Disposition pending delivery

## 2015-10-11 NOTE — Lactation Note (Signed)
This note was copied from the chart of [name]. Lactation Consultation Note  Patient Name: [name] ZOXWR'UToday's Date: 10/11/2015 Reason for consult: Initial assessment   Maternal Data  Lactatin basics reviewed. Mother reports good breast feeding. Feeding Feeding Type:  (attempt) Length of feed: 30 min  LATCH Score/Interventions                      Lactation Tools Discussed/Used     Consult Status      Lindsey Bell 10/11/2015, 4:19 PM

## 2015-10-12 LAB — SURGICAL PATHOLOGY

## 2015-10-12 MED ORDER — OXYCODONE-ACETAMINOPHEN 5-325 MG PO TABS: 1.0000 | ORAL_TABLET | Freq: Four times a day (QID) | ORAL | Status: DC | PRN

## 2015-10-12 MED ORDER — IBUPROFEN 600 MG PO TABS: 600.0000 mg | ORAL_TABLET | Freq: Four times a day (QID) | ORAL | Status: DC | PRN

## 2015-10-12 NOTE — Progress Notes (Signed)
Patient understands all discharge instructions and the need to make follow up appointments. Patient discharge via wheelchair with auxillary.Bleeding: Your bleeding could continue up to 6 weeks, the flow should gradually decrease and the color should become dark then lightened over the next couple of weeks. If you notice you are bleeding heavily or passing clots larger than the size of your fist, PLEASE call your physician. No TAMPONS, DOUCHING, ENEMAS OR SEXUAL INTERCOURSE for 6 weeks.   Stitches: Shower daily with mild soap and water. Stitches will dissolve over the next couple of weeks, if you experience any discomfort in the vaginal area you may sit in warm water 15-20 minutes, 3-4 times per day. Just enough water to cover vaginal area.   AfterPains: This is the uterus contracting back to its normal position and size. Use medications prescribed or recommended by your physician to help relieve this discomfort.   Bowels/Hemorrhoids: Drink plenty of water and stay active. Increase fiber, fresh fruits and vegetables in your diet.   Rest/Activity: Rest when the baby is resting  Bathing: Shower daily!  Diet: Continue to eat extra calories until your follow up visit to help replenish nutrients and vitamins. If breastfeeding eat an extra [phone] and increase your fluid intake to 12 glasses a day.   Contraception: Consult with your physician on what method of birth control you would like to use.   Postpartum "BLUES": It is common to emotional days after delivery, however if it persist for greater than 2 weeks or if you feel concerned please let your physician know immediately. This is hormone driven and nothing you can control so please let someone know how you feel.  Follow Up Visit: Please schedule a follow up visit with your physician.

## 2015-10-12 NOTE — Progress Notes (Signed)
Daily Post Partum Note  Lindsey Bell is a 39 y.o. [id number]  POD#2 s/p repeat c-section and BTL @ [redacted]w[redacted]d.  Pregnancy c/b cHTN, AMA, BMI 45, GDMA2 (on glyburide)  24hr/overnight events:  none  Subjective:  Pt doing well; meeting all PP/PO goals including flatus. No BMs yet  Objective:   Filed Vitals:   10/11/15 2121 10/12/15 0033 10/12/15 0329 10/12/15 0716  BP: 126/65   117/69  Pulse: 85   79  Temp: 97.8 F (36.6 C) 98.1 F (36.7 C) 97.8 F (36.6 C) 98 F (36.7 C)  TempSrc: Oral Oral Oral Oral  Resp: 20   18  Height:      Weight:      SpO2:    100%     Current Vital Signs 24h Vital Sign Ranges  T 98 F (36.7 C) Temp  Avg: 98.1 F (36.7 C)  Min: 97.8 F (36.6 C)  Max: 98.6 F (37 C)  BP 117/69 mmHg BP  Min: 117/69  Max: 126/65  HR 79 Pulse  Avg: 83  Min: 79  Max: 85  RR 18 Resp  Avg: 18.7  Min: 18  Max: 20  SaO2 100 % Not Delivered SpO2  Avg: 100 %  Min: 100 %  Max: 100 %       24 Hour I/O Current Shift I/O  Time Ins Outs 12/22 0701 - 12/23 0700 In: -  Out: 1900 [Urine:1900]      General: NAD Abdomen: obese, +BS, soft, ND, NTTP, FF below the umbilicus. C/d/i with on q pump in place Perineum: deferred Skin:  Warm and dry.  Cardiovascular: Regular rate and rhythm. Respiratory:  Clear to auscultation bilateral. Normal respiratory effort Extremities: no c/c/e  Medications Current Facility-Administered Medications  Medication Dose Route Frequency Provider Last Rate Last Dose  . acetaminophen (TYLENOL) tablet 650 mg  650 mg Oral Q4H PRN [name], MD      . bupivacaine 0.25 % ON-Q pump DUAL CATH 400 mL  400 mL Other Continuous [name], MD   400 mL at 10/10/15 1644  . witch hazel-glycerin (TUCKS) pad 1 application  1 application Topical PRN [name], MD       And  . dibucaine (NUPERCAINAL) 1 % rectal ointment 1 application  1 application Rectal PRN [name], MD      . diphenhydrAMINE (BENADRYL) capsule 25 mg  25 mg Oral Q6H PRN  [name], MD   25 mg at 10/11/15 0455  . ibuprofen (ADVIL,MOTRIN) tablet 600 mg  600 mg Oral 4 times per day [name], MD   600 mg at 10/12/15 0314  . lactated ringers infusion   Intravenous Continuous [name], MD 125 mL/hr at 10/11/15 0455    . lanolin ointment 1 application  1 application Topical PRN [name], MD      . menthol-cetylpyridinium (CEPACOL) lozenge 3 mg  1 lozenge Oral Q2H PRN [name], MD      . prenatal multivitamin tablet 1 tablet  1 tablet Oral Q1200 [name], MD   1 tablet at 10/11/15 1413  . senna-docusate (Senokot-S) tablet 2 tablet  2 tablet Oral Q24H [name], MD   2 tablet at 10/11/15 0000  . simethicone (MYLICON) chewable tablet 80 mg  80 mg Oral TID PC [name], MD   80 mg at 10/11/15 1751  . simethicone (MYLICON) chewable tablet 80 mg  80 mg Oral Q24H [name], MD   80 mg at 10/12/15 0036  .  simethicone (MYLICON) chewable tablet 80 mg  80 mg Oral PRN Vena AustriaAndreas Staebler, MD       Facility-Administered Medications Ordered in Other Encounters  Medication Dose Route Frequency Provider Last Rate Last Dose  . lactated ringers infusion   Intravenous Continuous Rosaria FerriesJoseph K Piscitello, MD   Stopped at 10/10/15 1552    Labs:   Recent Labs Lab 10/09/15 1024 10/11/15 0617  WBC 10.1 10.9  HGB 13.6 11.8*  HCT 40.3 35.6  PLT 199 173   No results for input(s): NA, K, CL, CO2, BUN, CREATININE, LABGLOM, GLUCOSE, CALCIUM in the last 168 hours.  Assessment & Plan:  Pt doing well *Postpartum/postop: routine care. Breast feeding. Follow up final path.  *Dispo: pt would like d/c home today.    Cornelia Copaharlie Eleri Ruben, Jr. MD [facility] Pager [phone]

## 2015-10-12 NOTE — Discharge Instructions (Signed)
° °Cesarean Delivery, Care After °Refer to this sheet in the next few weeks. These instructions provide you with information on caring for yourself after your procedure. Your health care provider may also give you specific instructions. Your treatment has been planned according to current medical practices, but problems sometimes occur. Call your health care provider if you have any problems or questions after you go home. °HOME CARE INSTRUCTIONS  °· If you have an On-Q pump, remove it on the 5th day after your surgery, by removing the dressing/bandage and pulling the pump out. Cover the site where the pump strings came out with a band-aid, as needed. °· Only take over-the-counter or prescription medications as directed by your health care provider. °· Do not drink alcohol, especially if you are breastfeeding or taking medication to relieve pain. °· Do not  smoke tobacco. °· Continue to use good perineal care. Good perineal care includes: °¨ Wiping your perineum from front to back. °¨ Keeping your perineum clean. °· Check your surgical cut (incision) daily for increased redness, drainage, swelling, or separation of skin. °· Shower and clean your incision gently with soap and water every day, by letting warm and soapy water run over the incision, and then pat it dry. If your health care provider says it is okay, leave the incision uncovered. Use a bandage (dressing) if the incision is draining fluid or appears irritated. If the adhesive strips across the incision do not fall off within 7 days, carefully peel them off, after a shower. °· Hug a pillow when coughing or sneezing until your incision is healed. This helps to relieve pain. °· Do not use tampons, douches or have sexual intercourse, until your health care provider says it is okay. °· Wear a well-fitting bra that provides breast support. °· Limit wearing support panties or control-top hose. °· Drink enough fluids to keep your urine clear or pale  yellow. °· Eat high-fiber foods such as whole grain cereals and breads, brown rice, beans, and fresh fruits and vegetables every day. These foods may help prevent or relieve constipation. °· Resume activities such as climbing stairs, driving, lifting, exercising, or traveling as directed by your health care provider. °· Try to have someone help you with your household activities and your newborn for at least a few days after you leave the hospital. °· Rest as much as possible. Try to rest or take a nap when your newborn is sleeping. °· Increase your activities gradually. °· Do not lift more than 15lbs until directed by a provider. °· Keep all of your scheduled postpartum appointments. It is very important to keep your scheduled follow-up appointments. At these appointments, your health care provider will be checking to make sure that you are healing physically and emotionally. °SEEK MEDICAL CARE IF:  °· You are passing large clots from your vagina. Save any clots to show your health care provider. °· You have a foul smelling discharge from your vagina. °· You have trouble urinating. °· You are urinating frequently. °· You have pain when you urinate. °· You have a change in your bowel movements. °· You have increasing redness, pain, or swelling near your incision. °· You have pus draining from your incision. °· Your incision is separating. °· You have painful, hard, or reddened breasts. °· You have a severe headache. °· You have blurred vision or see spots. °· You feel sad or depressed. °· You have thoughts of hurting yourself or your newborn. °· You have questions about your   care, the care of your newborn, or medications. °· You are dizzy or light-headed. °· You have a rash. °· You have pain, redness, or swelling at the site of the removed intravenous access (IV) tube. °· You have nausea or vomiting. °· You stopped breastfeeding and have not had a menstrual period within 12 weeks of stopping. °· You are not  breastfeeding and have not had a menstrual period within 12 weeks of delivery. °· You have a fever. °SEEK IMMEDIATE MEDICAL CARE IF: °· You have persistent pain. °· You have chest pain. °· You have shortness of breath. °· You faint. °· You have leg pain. °· You have stomach pain. °· Your vaginal bleeding saturates 2 or more sanitary pads in 1 hour. °MAKE SURE YOU:  °· Understand these instructions. °· Will watch your condition. °· Will get help right away if you are not doing well or get worse. °Document Released: 06/28/2002 Document Revised: 02/20/2014 Document Reviewed: 06/02/2012 °ExitCare® Patient Information ©2015 [employer]. This information is not intended to replace advice given to you by your health care provider. Make sure you discuss any questions you have with your health care provider. ° ° °

## 2016-04-11 ENCOUNTER — Other Ambulatory Visit: Payer: Self-pay | Admitting: Obstetrics and Gynecology

## 2017-09-21 DIAGNOSIS — S59902A Unspecified injury of left elbow, initial encounter: Secondary | ICD-10-CM | POA: Diagnosis not present

## 2017-09-21 DIAGNOSIS — M545 Low back pain: Secondary | ICD-10-CM | POA: Diagnosis not present

## 2017-09-21 DIAGNOSIS — S300XXA Contusion of lower back and pelvis, initial encounter: Secondary | ICD-10-CM | POA: Diagnosis not present

## 2017-09-21 DIAGNOSIS — W108XXA Fall (on) (from) other stairs and steps, initial encounter: Secondary | ICD-10-CM | POA: Diagnosis not present

## 2018-01-10 DIAGNOSIS — R05 Cough: Secondary | ICD-10-CM | POA: Diagnosis not present

## 2018-01-10 DIAGNOSIS — R6889 Other general symptoms and signs: Secondary | ICD-10-CM | POA: Diagnosis not present

## 2018-01-10 DIAGNOSIS — J40 Bronchitis, not specified as acute or chronic: Secondary | ICD-10-CM | POA: Diagnosis not present

## 2018-02-28 DIAGNOSIS — N390 Urinary tract infection, site not specified: Secondary | ICD-10-CM | POA: Diagnosis not present

## 2018-02-28 DIAGNOSIS — R399 Unspecified symptoms and signs involving the genitourinary system: Secondary | ICD-10-CM | POA: Diagnosis not present

## 2018-04-12 DIAGNOSIS — M47812 Spondylosis without myelopathy or radiculopathy, cervical region: Secondary | ICD-10-CM | POA: Diagnosis not present

## 2018-04-12 DIAGNOSIS — M62838 Other muscle spasm: Secondary | ICD-10-CM | POA: Diagnosis not present

## 2018-04-12 DIAGNOSIS — R079 Chest pain, unspecified: Secondary | ICD-10-CM | POA: Diagnosis not present

## 2018-04-12 DIAGNOSIS — R0789 Other chest pain: Secondary | ICD-10-CM | POA: Diagnosis not present

## 2018-04-12 DIAGNOSIS — M4802 Spinal stenosis, cervical region: Secondary | ICD-10-CM | POA: Diagnosis not present

## 2018-04-12 DIAGNOSIS — M542 Cervicalgia: Secondary | ICD-10-CM | POA: Diagnosis not present

## 2018-05-25 ENCOUNTER — Ambulatory Visit (INDEPENDENT_AMBULATORY_CARE_PROVIDER_SITE_OTHER): Payer: BLUE CROSS/BLUE SHIELD | Admitting: Obstetrics and Gynecology

## 2018-05-25 ENCOUNTER — Encounter: Payer: Self-pay | Admitting: Obstetrics and Gynecology

## 2018-05-25 VITALS — BP 142/90 | HR 81 | Ht 66.0 in | Wt 267.0 lb

## 2018-05-25 DIAGNOSIS — Z131 Encounter for screening for diabetes mellitus: Secondary | ICD-10-CM

## 2018-05-25 DIAGNOSIS — Z6841 Body Mass Index (BMI) 40.0 and over, adult: Secondary | ICD-10-CM

## 2018-05-25 DIAGNOSIS — R030 Elevated blood-pressure reading, without diagnosis of hypertension: Secondary | ICD-10-CM

## 2018-05-25 DIAGNOSIS — Z13220 Encounter for screening for lipoid disorders: Secondary | ICD-10-CM

## 2018-05-25 DIAGNOSIS — Z1231 Encounter for screening mammogram for malignant neoplasm of breast: Secondary | ICD-10-CM

## 2018-05-25 DIAGNOSIS — Z01419 Encounter for gynecological examination (general) (routine) without abnormal findings: Secondary | ICD-10-CM

## 2018-05-25 DIAGNOSIS — Z8632 Personal history of gestational diabetes: Secondary | ICD-10-CM

## 2018-05-25 DIAGNOSIS — Z1329 Encounter for screening for other suspected endocrine disorder: Secondary | ICD-10-CM

## 2018-05-25 DIAGNOSIS — Z1239 Encounter for other screening for malignant neoplasm of breast: Secondary | ICD-10-CM

## 2018-05-25 NOTE — Patient Instructions (Signed)
[facility] [address] [place] [address]  [facility] [place]  [address] [place] [address]  Phone: [phone]  

## 2018-05-25 NOTE — Progress Notes (Signed)
Gynecology Annual Exam  PCP: Ward, Elenora Fender, MD  Chief Complaint:  Chief Complaint  Patient presents with  . Gynecologic Exam    History of Present Illness: Patient is a 42 y.o. G3P2010 presents for annual exam. The patient has no complaints today.   LMP: Patient's last menstrual period was 05/21/2018 (exact date). Average Interval: regular, 26 days Duration of flow: 5 days Heavy Menses: no Clots: no Intermenstrual Bleeding: no Postcoital Bleeding: no Dysmenorrhea: no   The patient is sexually active. She currently uses tubal ligation for contraception. She denies dyspareunia.  The patient does perform self breast exams.  There is no notable family history of breast or ovarian cancer in her family.  The patient wears seatbelts: yes.   The patient has regular exercise: not asked.    The patient denies current symptoms of depression.    Review of Systems: Review of Systems  Constitutional: Negative for chills and fever.  HENT: Negative for congestion.   Respiratory: Negative for cough and shortness of breath.   Cardiovascular: Negative for chest pain and palpitations.  Gastrointestinal: Negative for abdominal pain, constipation, diarrhea, heartburn, nausea and vomiting.  Genitourinary: Negative for dysuria, frequency and urgency.  Skin: Negative for itching and rash.  Neurological: Negative for dizziness and headaches.  Endo/Heme/Allergies: Negative for polydipsia.  Psychiatric/Behavioral: Negative for depression.    Past Medical History:  Past Medical History:  Diagnosis Date  . Borderline hypertension   . Gestational diabetes 2016    Past Surgical History:  Past Surgical History:  Procedure Laterality Date  . CESAREAN SECTION  2005  . CESAREAN SECTION N/A 10/10/2015   Procedure: CESAREAN SECTION;  Surgeon: [name], MD;  Location: [facility] ORS;  Service: Obstetrics;  Laterality: N/A;  . DIAGNOSTIC LAPAROSCOPY  2015  . DILATION AND CURETTAGE OF UTERUS     . TUBAL LIGATION  2016    Gynecologic History:  Patient's last menstrual period was 05/21/2018 (exact date). Contraception: tubal ligation Last Pap: Results were: 11/19/2015 NIL and HR HPV negative  Last mammogram: Due  Obstetric History: G3P2010  Family History:  Family History  Problem Relation Age of Onset  . Hypertension Mother   . Hypertension Father   . Stroke Maternal Grandfather   . Breast cancer Paternal Aunt 61    Social History:  Social History   Socioeconomic History  . Marital status: Married    Spouse name: Not on file  . Number of children: Not on file  . Years of education: Not on file  . Highest education level: Not on file  Occupational History  . Not on file  Social Needs  . Financial resource strain: Not on file  . Food insecurity:    Worry: Not on file    Inability: Not on file  . Transportation needs:    Medical: Not on file    Non-medical: Not on file  Tobacco Use  . Smoking status: Former Smoker    Packs/day: 0.50    Years: 15.00    Pack years: 7.50    Types: Cigarettes    Last attempt to quit: 10/21/2011    Years since quitting: 6.5  . Smokeless tobacco: Never Used  Substance and Sexual Activity  . Alcohol use: No    Alcohol/week: 0.0 oz  . Drug use: No  . Sexual activity: Yes  Lifestyle  . Physical activity:    Days per week: Not on file    Minutes per session: Not on file  . Stress:  Not on file  Relationships  . Social connections:    Talks on phone: Not on file    Gets together: Not on file    Attends religious service: Not on file    Active member of club or organization: Not on file    Attends meetings of clubs or organizations: Not on file    Relationship status: Not on file  . Intimate partner violence:    Fear of current or ex partner: Not on file    Emotionally abused: Not on file    Physically abused: Not on file    Forced sexual activity: Not on file  Other Topics Concern  . Not on file  Social History  Narrative  . Not on file    Allergies:  No Known Allergies  Medications: Prior to Admission medications   Medication Sig Start Date End Date Taking? Authorizing Provider  ibuprofen (ADVIL,MOTRIN) 600 MG tablet Take 1 tablet (600 mg total) by mouth every 6 (six) hours as needed for headache, mild pain or cramping. 10/12/15   [place] Bing, MD  oxyCODONE-acetaminophen (ROXICET) 5-325 MG tablet Take 1 tablet by mouth every 6 (six) hours as needed for severe pain. 10/12/15   [place] Bing, MD  Prenatal Vit-Fe Fumarate-FA (MULTIVITAMIN-PRENATAL) 27-0.8 MG TABS tablet Take 1 tablet by mouth daily at 12 noon.    [provider]    Physical Exam Vitals: Blood pressure (!) 142/90, pulse 81, height 5\' 6"  (1.676 m), weight 267 lb (121.1 kg), last menstrual period 05/21/2018, not currently breastfeeding. Body mass index is 43.09 kg/m.   General: NAD HEENT: normocephalic, anicteric Thyroid: no enlargement, no palpable nodules Pulmonary: No increased work of breathing, CTAB Cardiovascular: RRR, distal pulses 2+ Breast: Breast symmetrical, no tenderness, no palpable nodules or masses, no skin or nipple retraction present, no nipple discharge.  No axillary or supraclavicular lymphadenopathy. Abdomen: NABS, soft, non-tender, non-distended.  Umbilicus without lesions.  No hepatomegaly, splenomegaly or masses palpable. No evidence of hernia  Genitourinary:  External: Normal external female genitalia.  Normal urethral meatus, normal Bartholin's and Skene's glands.    Vagina: Normal vaginal mucosa, no evidence of prolapse.    Cervix: Grossly normal in appearance, no bleeding  Uterus: Non-enlarged, mobile, normal contour.  No CMT  Adnexa: ovaries non-enlarged, no adnexal masses  Rectal: deferred  Lymphatic: no evidence of inguinal lymphadenopathy Extremities: no edema, erythema, or tenderness Neurologic: Grossly intact Psychiatric: mood appropriate, affect full  Female chaperone  present for pelvic and breast  portions of the physical exam    Assessment: 42 y.o. G3P2010 routine annual exam  Plan: Problem List Items Addressed This Visit      Other   History of gestational diabetes   Relevant Orders   CMP14+LP+TP+TSH+CBC/Plt   Hemoglobin A1c   Morbid obesity (HCC)    Other Visit Diagnoses    Encounter for gynecological examination without abnormal finding    -  Primary   Breast screening       Relevant Orders   MM DIGITAL SCREENING BILATERAL   Elevated blood pressure reading without diagnosis of hypertension       Relevant Orders   CMP14+LP+TP+TSH+CBC/Plt   Thyroid disorder screening       Relevant Orders   CMP14+LP+TP+TSH+CBC/Plt   Lipid screening       Relevant Orders   CMP14+LP+TP+TSH+CBC/Plt   Screening for diabetes mellitus       Relevant Orders   CMP14+LP+TP+TSH+CBC/Plt      1) Mammogram - recommend yearly screening mammogram.  Mammogram Was ordered today   2) STI screening  was notoffered and therefore not obtained  3) ASCCP guidelines and rational discussed.  Patient opts for every 3 years screening interval  4) Contraception - the patient is currently using  tubal ligation.  She is happy with her current form of contraception and plans to continue  5) Colonoscopy -- Screening recommended starting at age 445 for average risk individuals, age 41 for individuals deemed at increased risk (including African Americans) and recommended to continue until age 41.  For patient age 41-85 individualized approach is recommended.  Gold standard screening is via colonoscopy, Cologuard screening is an acceptable alternative for patient unwilling or unable to undergo colonoscopy.  "Colorectal cancer screening for average?risk adults: 2018 guideline update from the American Cancer Society"[place]: A Cancer Journal for Clinicians: Mar 18, 2017   6) Routine healthcare maintenance including cholesterol, diabetes screening discussed Ordered today  7) Return in  about 3 months (around 08/25/2018) for BP check in 3 months, Annual 1 year.   Vena AustriaAndreas Korinna Tat, MD, Evern CoreFACOG [facility], [facility] 05/25/2018, 8:45 AM

## 2018-05-26 LAB — HEMOGLOBIN A1C
Est. average glucose Bld gHb Est-mCnc: 114 mg/dL
Hgb A1c MFr Bld: 5.6 % (ref 4.8–5.6)

## 2018-05-26 LAB — CMP14+LP+TP+TSH+CBC/PLT
ALT: 21 IU/L (ref 0–32)
AST: 18 IU/L (ref 0–40)
Albumin/Globulin Ratio: 1.9 (ref 1.2–2.2)
Albumin: 4.6 g/dL (ref 3.5–5.5)
Alkaline Phosphatase: 63 IU/L (ref 39–117)
BUN/Creatinine Ratio: 14 (ref 9–23)
BUN: 13 mg/dL (ref 6–24)
Bilirubin Total: <0.2 mg/dL (ref 0.0–1.2)
CHOLESTEROL TOTAL: 178 mg/dL (ref 100–199)
CO2: 17 mmol/L — AB (ref 20–29)
Calcium: 9.6 mg/dL (ref 8.7–10.2)
Chloride: 102 mmol/L (ref 96–106)
Creatinine, Ser: 0.96 mg/dL (ref 0.57–1.00)
FREE THYROXINE INDEX: 1.7 (ref 1.2–4.9)
GFR calc Af Amer: 86 mL/min/{1.73_m2} (ref >59)
GFR, EST NON AFRICAN AMERICAN: 74 mL/min/{1.73_m2} (ref >59)
GLOBULIN, TOTAL: 2.4 g/dL (ref 1.5–4.5)
Glucose: 91 mg/dL (ref 65–99)
HDL: 51 mg/dL (ref >39)
Hematocrit: 43.3 % (ref 34.0–46.6)
Hemoglobin: 14.6 g/dL (ref 11.1–15.9)
LDL CALC: 90 mg/dL (ref 0–99)
LDl/HDL Ratio: 1.8 ratio (ref 0.0–3.2)
MCH: 30.1 pg (ref 26.6–33.0)
MCHC: 33.7 g/dL (ref 31.5–35.7)
MCV: 89 fL (ref 79–97)
PLATELETS: 271 10*3/uL (ref 150–450)
Potassium: 4.5 mmol/L (ref 3.5–5.2)
RBC: 4.85 x10E6/uL (ref 3.77–5.28)
RDW: 13.1 % (ref 12.3–15.4)
SODIUM: 142 mmol/L (ref 134–144)
T3 UPTAKE RATIO: 25 % (ref 24–39)
T4, Total: 6.7 ug/dL (ref 4.5–12.0)
TRIGLYCERIDES: 184 mg/dL — AB (ref 0–149)
TSH: 2.9 u[IU]/mL (ref 0.450–4.500)
Total Protein: 7 g/dL (ref 6.0–8.5)
VLDL Cholesterol Cal: 37 mg/dL (ref 5–40)
WBC: 6.3 10*3/uL (ref 3.4–10.8)

## 2018-06-22 ENCOUNTER — Ambulatory Visit
Admission: RE | Admit: 2018-06-22 | Discharge: 2018-06-22 | Disposition: A | Discharge status: Home / Self Care | Payer: BLUE CROSS/BLUE SHIELD | Source: Ambulatory Visit | Attending: Obstetrics and Gynecology | Admitting: Obstetrics and Gynecology

## 2018-06-22 ENCOUNTER — Encounter: Payer: Self-pay | Admitting: Radiology

## 2018-06-22 DIAGNOSIS — Z1231 Encounter for screening mammogram for malignant neoplasm of breast: Secondary | ICD-10-CM | POA: Insufficient documentation

## 2018-06-22 DIAGNOSIS — Z1239 Encounter for other screening for malignant neoplasm of breast: Secondary | ICD-10-CM

## 2018-08-26 ENCOUNTER — Encounter: Payer: Self-pay | Admitting: Obstetrics and Gynecology

## 2018-08-26 ENCOUNTER — Ambulatory Visit (INDEPENDENT_AMBULATORY_CARE_PROVIDER_SITE_OTHER): Payer: BLUE CROSS/BLUE SHIELD | Admitting: Obstetrics and Gynecology

## 2018-08-26 VITALS — BP 130/80 | HR 61 | Wt 262.0 lb

## 2018-08-26 DIAGNOSIS — R232 Flushing: Secondary | ICD-10-CM | POA: Diagnosis not present

## 2018-08-26 DIAGNOSIS — F411 Generalized anxiety disorder: Secondary | ICD-10-CM

## 2018-08-26 MED ORDER — ESCITALOPRAM OXALATE 10 MG PO TABS: 10.0000 mg | ORAL_TABLET | Freq: Every day | ORAL | 3 refills | Status: DC

## 2018-08-26 NOTE — Progress Notes (Signed)
Obstetrics & Gynecology Office Visit   Chief Complaint:  Chief Complaint  Patient presents with  . Blood Pressure Check    History of Present Illness: 42 y.o. G3P2010 being seen for follow up blood pressure check today.  The patient is not pregnantThe established diagnosis for the patient is elevated BP at time of annual exam.  She is currently on no antihypertensives.  She reports no current symptoms attributable to her blood pressure.  Medication list reviewed medications which may contribute to BP elevation were not noted and no medications contraindicated for use in patient with current hypertension were noted.  The patient is a 42 y.o. female presenting initial evaluation for symptoms of anxiety.  The patient is currently taking nothing for the management of her symptoms.  She has had any recent situational stressors.  Her daughter is currently undergoing a work up for intermittent but severe bouts of abdominal pain with thus far no etiology.  She reports symptoms of anhedonia, day time somnolence, insomnia, irritability and social anxiety.  She denies risk taking behavior, feelings of worthlessness, suicidal ideation, homicidal ideation, auditory hallucinations and visual hallucinations.   The patient does not have a pre-existing history of depression and anxiety.  She  does not a prior history of suicide attempts.    Review of Systems: Review of Systems  Constitutional: Negative.   Gastrointestinal: Negative.   Neurological: Negative for headaches.  Psychiatric/Behavioral: Negative for depression, hallucinations, substance abuse and suicidal ideas. The patient is nervous/anxious and has insomnia.    Past Medical History:  Past Medical History:  Diagnosis Date  . Borderline hypertension   . Gestational diabetes 2016    Past Surgical History:  Past Surgical History:  Procedure Laterality Date  . CESAREAN SECTION  2005  . CESAREAN SECTION N/A 10/10/2015   Procedure:  CESAREAN SECTION;  Surgeon: [name], MD;  Location: [facility] ORS;  Service: Obstetrics;  Laterality: N/A;  . DIAGNOSTIC LAPAROSCOPY  2015  . DILATION AND CURETTAGE OF UTERUS    . TUBAL LIGATION  2016    Gynecologic History: Patient's last menstrual period was 08/23/2018 (exact date).  Obstetric History: G3P2010  Family History:  Family History  Problem Relation Age of Onset  . Hypertension Mother   . Hypertension Father   . Stroke Maternal Grandfather   . Breast cancer Paternal Aunt 70    Social History:  Social History   Socioeconomic History  . Marital status: Married    Spouse name: Not on file  . Number of children: Not on file  . Years of education: Not on file  . Highest education level: Not on file  Occupational History  . Not on file  Social Needs  . Financial resource strain: Not on file  . Food insecurity:    Worry: Not on file    Inability: Not on file  . Transportation needs:    Medical: Not on file    Non-medical: Not on file  Tobacco Use  . Smoking status: Former Smoker    Packs/day: 0.50    Years: 15.00    Pack years: 7.50    Types: Cigarettes    Last attempt to quit: 10/21/2011    Years since quitting: 6.8  . Smokeless tobacco: Never Used  Substance and Sexual Activity  . Alcohol use: No    Alcohol/week: 0.0 standard drinks  . Drug use: No  . Sexual activity: Yes  Lifestyle  . Physical activity:    Days per week: Not  on file    Minutes per session: Not on file  . Stress: Not on file  Relationships  . Social connections:    Talks on phone: Not on file    Gets together: Not on file    Attends religious service: Not on file    Active member of club or organization: Not on file    Attends meetings of clubs or organizations: Not on file    Relationship status: Not on file  . Intimate partner violence:    Fear of current or ex partner: Not on file    Emotionally abused: Not on file    Physically abused: Not on file    Forced sexual  activity: Not on file  Other Topics Concern  . Not on file  Social History Narrative  . Not on file    Allergies:  No Known Allergies  Medications: Prior to Admission medications   Not on File    Physical Exam Blood pressure 130/80, pulse 61, weight 262 lb (118.8 kg), last menstrual period 08/23/2018.  Patient's last menstrual period was 08/23/2018 (exact date).  General: NAD HEENT: normocephalic, anicteric Pulmonary: No increased work of breathing Cardiovascular: RRR, distal pulses 2+ Neurologic: Grossly intact Psychiatric: mood appropriate, affect full  Assessment: 42 y.o. G3P2010 presenting for blood pressure evaluation today, anxiety  Plan: Problem List Items Addressed This Visit      Other   Morbid obesity (HCC)   Relevant Orders   TSH (Completed)    Other Visit Diagnoses    Generalized anxiety disorder    -  Primary   Relevant Medications   escitalopram (LEXAPRO) 10 MG tablet   Other Relevant Orders   CBC (Completed)   TSH (Completed)   B12 (Completed)   Vasomotor flushing       Relevant Orders   TSH (Completed)      1) Blood pressure - blood pressure at today's visit is normotensive.  As a result antihypertensive therapy is currently not warranted. - additional blood work was not obtained and was obtained including: - CBC, TSH, and B12 checked  2) Anxiety - large situational component but GAD-7 is 21 today.  Will start patient on lexapro 10mg , discussed side-effect profile.  If worsening of symptoms or intolerable side-effect noted to re-present prior to 4 week follow up   3) A total of 25 minutes were spent in face-to-face contact with the patient during this encounter with over half of that time devoted to counseling and coordination of care.  4) Return in about 4 weeks (around 09/23/2018) for medication follow up.    [name], MD, [facility], [facility] 08/26/2018, 8:15 AM

## 2018-08-27 LAB — CBC
Hematocrit: 41.9 % (ref 34.0–46.6)
Hemoglobin: 13.9 g/dL (ref 11.1–15.9)
MCH: 29.6 pg (ref 26.6–33.0)
MCHC: 33.2 g/dL (ref 31.5–35.7)
MCV: 89 fL (ref 79–97)
PLATELETS: 262 10*3/uL (ref 150–450)
RBC: 4.69 x10E6/uL (ref 3.77–5.28)
RDW: 12.4 % (ref 12.3–15.4)
WBC: 4.9 10*3/uL (ref 3.4–10.8)

## 2018-08-27 LAB — TSH: TSH: 2.6 u[IU]/mL (ref 0.450–4.500)

## 2018-08-27 LAB — VITAMIN B12: Vitamin B-12: 358 pg/mL (ref 232–1245)

## 2018-09-27 ENCOUNTER — Encounter: Payer: Self-pay | Admitting: Obstetrics and Gynecology

## 2018-09-27 ENCOUNTER — Ambulatory Visit (INDEPENDENT_AMBULATORY_CARE_PROVIDER_SITE_OTHER): Payer: BLUE CROSS/BLUE SHIELD | Admitting: Obstetrics and Gynecology

## 2018-09-27 VITALS — BP 120/70 | Ht 66.0 in | Wt 262.0 lb

## 2018-09-27 DIAGNOSIS — F411 Generalized anxiety disorder: Secondary | ICD-10-CM | POA: Diagnosis not present

## 2018-09-27 MED ORDER — ESCITALOPRAM OXALATE 20 MG PO TABS
20.0000 mg | ORAL_TABLET | Freq: Every day | ORAL | 2 refills | Status: DC
Start: 2018-09-27 — End: 2018-10-25

## 2018-09-27 NOTE — Progress Notes (Signed)
Obstetrics & Gynecology Office Visit   Chief Complaint:  Chief Complaint  Patient presents with  . Follow-up    History of Present Illness: The patient is a 42 y.o. female presenting follow up for symptoms of anxiety.  The patient is currently taking Lexapro 10mg  for the management of her symptoms.  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  She reports symptoms of day time somnolence, insomnia and social anxiety.  She denies anhedonia, risk taking behavior, irritability, agorophobia, feelings of guilt, feelings of worthlessness, suicidal ideation, homicidal ideation, auditory hallucinations and visual hallucinations. Symptoms have improved since last visit.     The patient does not have a pre-existing history of depression and anxiety.  She  does a prior history of suicide attempts.    Review of Systems: Review of Systems  Constitutional: Negative.   Gastrointestinal: Negative.   Neurological: Negative for headaches.  Psychiatric/Behavioral: Negative for depression, hallucinations, memory loss, substance abuse and suicidal ideas. The patient is nervous/anxious and has insomnia.     Past Medical History:  Past Medical History:  Diagnosis Date  . Borderline hypertension   . Gestational diabetes 2016    Past Surgical History:  Past Surgical History:  Procedure Laterality Date  . CESAREAN SECTION  2005  . CESAREAN SECTION N/A 10/10/2015   Procedure: CESAREAN SECTION;  Surgeon: Vena AustriaAndreas Yousuf Ager, MD;  Location: [facility] ORS;  Service: Obstetrics;  Laterality: N/A;  . DIAGNOSTIC LAPAROSCOPY  2015  . DILATION AND CURETTAGE OF UTERUS    . TUBAL LIGATION  2016    Gynecologic History: Patient's last menstrual period was 09/18/2018 (exact date).  Obstetric History: G3P2010  Family History:  Family History  Problem Relation Age of Onset  .  Hypertension Mother   . Hypertension Father   . Stroke Maternal Grandfather   . Breast cancer Paternal Aunt [id number]    Social History:  Social History   Socioeconomic History  . Marital status: Married    Spouse name: Not on file  . Number of children: Not on file  . Years of education: Not on file  . Highest education level: Not on file  Occupational History  . Not on file  Social Needs  . Financial resource strain: Not on file  . Food insecurity:    Worry: Not on file    Inability: Not on file  . Transportation needs:    Medical: Not on file    Non-medical: Not on file  Tobacco Use  . Smoking status: Former Smoker    Packs/day: 0.50    Years: 15.00    Pack years: 7.50    Types: Cigarettes    Last attempt to quit: 10/21/2011    Years since quitting: 6.9  . Smokeless tobacco: Never Used  Substance and Sexual Activity  . Alcohol use: No    Alcohol/week: 0.0 standard drinks  . Drug use: No  . Sexual activity: Yes  Lifestyle  . Physical activity:    Days per week: Not on file    Minutes per session: Not on file  . Stress: Not on file  Relationships  . Social connections:    Talks on phone: Not on file    Gets together: Not on file    Attends religious service: Not on file    Active member of club or organization: Not on file  Attends meetings of clubs or organizations: Not on file    Relationship status: Not on file  . Intimate partner violence:    Fear of current or ex partner: Not on file    Emotionally abused: Not on file    Physically abused: Not on file    Forced sexual activity: Not on file  Other Topics Concern  . Not on file  Social History Narrative  . Not on file    Allergies:  No Known Allergies  Medications: Prior to Admission medications   Medication Sig Start Date End Date Taking? Authorizing Provider  escitalopram (LEXAPRO) 20 MG tablet Take 1 tablet (20 mg total) by mouth daily. 09/27/18   [name], MD    Physical Exam Vitals:    Vitals:   09/27/18 0924  BP: 120/70   Patient's last menstrual period was 09/18/2018 (exact date).  General: NAD HEENT: normocephalic, anicteric Pulmonary: No increased work of breathing Neurologic: Grossly intact Psychiatric: mood appropriate, affect full    GAD 7 : Generalized Anxiety Score 08/26/2018  Nervous, Anxious, on Edge 3  Control/stop worrying 3  Worry too much - different things 3  Trouble relaxing 3  Restless 3  Easily annoyed or irritable 3  Afraid - awful might happen 3  Total GAD 7 Score 21  Anxiety Difficulty Very difficult    Depression screen [facility] 2/9 04/16/2015  Decreased Interest 0  Down, Depressed, Hopeless 0  PHQ - 2 Score 0    Depression screen [facility] 2/9 04/16/2015  Decreased Interest 0  Down, Depressed, Hopeless 0  PHQ - 2 Score 0     Assessment: 42 y.o. G3P2010 generalized anxiety  Plan: Problem List Items Addressed This Visit    None    Visit Diagnoses    Generalized anxiety disorder    -  Primary   Relevant Medications   escitalopram (LEXAPRO) 20 MG tablet      1) Generalized anxiety  - GAD-7 down to 10 -No side-effects with lexapro.  Increase lexapro to 20mg  daily - discussed propranolol for performance anxiety during licensure exam  2) Thyroid and B12 screen has been obtained previously  3) A total of 15 minutes were spent in face-to-face contact with the patient during this encounter with over half of that time devoted to counseling and coordination of care.  4) Return in about 4 weeks (around 10/25/2018) for medication follow up.    [name], MD, [facility], [facility] 09/27/2018, 4:21 PM

## 2018-10-06 DIAGNOSIS — N390 Urinary tract infection, site not specified: Secondary | ICD-10-CM | POA: Diagnosis not present

## 2018-10-06 DIAGNOSIS — R300 Dysuria: Secondary | ICD-10-CM | POA: Diagnosis not present

## 2018-10-06 DIAGNOSIS — B373 Candidiasis of vulva and vagina: Secondary | ICD-10-CM | POA: Diagnosis not present

## 2018-10-25 ENCOUNTER — Encounter: Payer: Self-pay | Admitting: Obstetrics and Gynecology

## 2018-10-25 ENCOUNTER — Ambulatory Visit (INDEPENDENT_AMBULATORY_CARE_PROVIDER_SITE_OTHER): Payer: BLUE CROSS/BLUE SHIELD | Admitting: Obstetrics and Gynecology

## 2018-10-25 VITALS — BP 138/92 | HR 89 | Ht 66.0 in | Wt 272.0 lb

## 2018-10-25 DIAGNOSIS — F329 Major depressive disorder, single episode, unspecified: Secondary | ICD-10-CM | POA: Diagnosis not present

## 2018-10-25 DIAGNOSIS — Z23 Encounter for immunization: Secondary | ICD-10-CM | POA: Diagnosis not present

## 2018-10-25 DIAGNOSIS — F419 Anxiety disorder, unspecified: Secondary | ICD-10-CM

## 2018-10-25 DIAGNOSIS — F32A Depression, unspecified: Secondary | ICD-10-CM

## 2018-10-25 MED ORDER — TRAZODONE HCL 50 MG PO TABS: 50.0000 mg | ORAL_TABLET | Freq: Every evening | ORAL | 11 refills | Status: DC | PRN

## 2018-10-25 MED ORDER — ESCITALOPRAM OXALATE 20 MG PO TABS
20.0000 mg | ORAL_TABLET | Freq: Every day | ORAL | 11 refills | Status: DC
Start: 2018-10-25 — End: 2019-07-29

## 2018-10-25 NOTE — Progress Notes (Signed)
Obstetrics & Gynecology Office Visit   Chief Complaint:  Chief Complaint  Patient presents with  . Follow-up    Medication    History of Present Illness: The patient is a 43 y.o. female presenting follow up for symptoms of anxiety and depression.  The patient is currently taking Lexapro 20mg  for the management of her symptoms.  She has not had any recent situational stressors.  She reports good control of symptoms.  She denies anhedonia, social anxiety, agorophobia, feelings of guilt, feelings of worthlessness, suicidal ideation, homicidal ideation, auditory hallucinations and visual hallucinations. Symptoms have improved since last visit.     Falling asleep ok but still having some problems staying asleep.  Review of Systems: Review of Systems  Constitutional: Negative.   Gastrointestinal: Negative for nausea.  Neurological: Negative for headaches.  Psychiatric/Behavioral: Negative for depression, hallucinations, memory loss, substance abuse and suicidal ideas. The patient has insomnia. The patient is not nervous/anxious.      Past Medical History:  Past Medical History:  Diagnosis Date  . Borderline hypertension   . Gestational diabetes 2016    Past Surgical History:  Past Surgical History:  Procedure Laterality Date  . CESAREAN SECTION  2005  . CESAREAN SECTION N/A 10/10/2015   Procedure: CESAREAN SECTION;  Surgeon: [name], MD;  Location: [facility] ORS;  Service: Obstetrics;  Laterality: N/A;  . DIAGNOSTIC LAPAROSCOPY  2015  . DILATION AND CURETTAGE OF UTERUS    . TUBAL LIGATION  2016    Gynecologic History: Patient's last menstrual period was 10/11/2018 (exact date).  Obstetric History: G3P2010  Family History:  Family History  Problem Relation Age of Onset  . Hypertension Mother   . Hypertension Father   . Stroke Maternal Grandfather   . Breast cancer Paternal Aunt 49    Social History:  Social History   Socioeconomic History  . Marital  status: Married    Spouse name: Not on file  . Number of children: Not on file  . Years of education: Not on file  . Highest education level: Not on file  Occupational History  . Not on file  Social Needs  . Financial resource strain: Not on file  . Food insecurity:    Worry: Not on file    Inability: Not on file  . Transportation needs:    Medical: Not on file    Non-medical: Not on file  Tobacco Use  . Smoking status: Former Smoker    Packs/day: 0.50    Years: 15.00    Pack years: 7.50    Types: Cigarettes    Last attempt to quit: 10/21/2011    Years since quitting: 7.0  . Smokeless tobacco: Never Used  Substance and Sexual Activity  . Alcohol use: No    Alcohol/week: 0.0 standard drinks  . Drug use: No  . Sexual activity: Yes  Lifestyle  . Physical activity:    Days per week: Not on file    Minutes per session: Not on file  . Stress: Not on file  Relationships  . Social connections:    Talks on phone: Not on file    Gets together: Not on file    Attends religious service: Not on file    Active member of club or organization: Not on file    Attends meetings of clubs or organizations: Not on file    Relationship status: Not on file  . Intimate partner violence:    Fear of current or ex partner: Not  on file    Emotionally abused: Not on file    Physically abused: Not on file    Forced sexual activity: Not on file  Other Topics Concern  . Not on file  Social History Narrative  . Not on file    Allergies:  No Known Allergies  Medications: Prior to Admission medications   Medication Sig Start Date End Date Taking? Authorizing Provider  escitalopram (LEXAPRO) 20 MG tablet Take 1 tablet (20 mg total) by mouth daily. 10/25/18  Yes [name], MD  traZODone (DESYREL) 50 MG tablet Take 1 tablet (50 mg total) by mouth at bedtime as needed for sleep. 10/25/18   [name], MD    Physical Exam Vitals:  Vitals:   10/25/18 0854  BP: (!) 138/92  Pulse: 89    Patient's last menstrual period was 10/11/2018 (exact date).  General: NAD HEENT: normocephalic, anicteric Pulmonary: No increased work of breathing Neurologic: Grossly intact Psychiatric: mood appropriate, affect full    GAD 7 : Generalized Anxiety Score 10/25/2018 08/26/2018  Nervous, Anxious, on Edge 1 3  Control/stop worrying 1 3  Worry too much - different things 1 3  Trouble relaxing 1 3  Restless 1 3  Easily annoyed or irritable 1 3  Afraid - awful might happen 0 3  Total GAD 7 Score 6 21  Anxiety Difficulty Somewhat difficult Very difficult    Depression screen [facility] 2/9 10/25/2018 04/16/2015  Decreased Interest 0 0  Down, Depressed, Hopeless 0 0  PHQ - 2 Score 0 0  Altered sleeping 2 -  Tired, decreased energy 2 -  Change in appetite 1 -  Feeling bad or failure about yourself  0 -  Trouble concentrating 0 -  Moving slowly or fidgety/restless 0 -  Suicidal thoughts 0 -  PHQ-9 Score 5 -  Difficult doing work/chores Somewhat difficult -    Depression screen [facility] 2/9 10/25/2018 04/16/2015  Decreased Interest 0 0  Down, Depressed, Hopeless 0 0  PHQ - 2 Score 0 0  Altered sleeping 2 -  Tired, decreased energy 2 -  Change in appetite 1 -  Feeling bad or failure about yourself  0 -  Trouble concentrating 0 -  Moving slowly or fidgety/restless 0 -  Suicidal thoughts 0 -  PHQ-9 Score 5 -  Difficult doing work/chores Somewhat difficult -     Assessment: 43 y.o. G3P2010 follow up anxiety/depression  Plan: Problem List Items Addressed This Visit    None    Visit Diagnoses    Flu vaccine need    -  Primary   Relevant Orders   Flu Vaccine QUAD 36+ mos IM (Completed)   Anxiety and depression       Relevant Medications   escitalopram (LEXAPRO) 20 MG tablet   traZODone (DESYREL) 50 MG tablet      1) Anxiety and depression  - GAD-7 is 6 PHQ_9 is 5 - Doing well continue Lexapro 20mg  - Still some sleep issues (staying asleep) add trazedone   2) Thyroid and B12  screen has been obtained previously   [name], MD, [facility], [facility]

## 2018-11-24 NOTE — Telephone Encounter (Signed)
Your patient. Not sure why it was sent to me.Marland KitchenMarland Kitchen

## 2018-11-25 ENCOUNTER — Other Ambulatory Visit: Payer: Self-pay | Admitting: Obstetrics and Gynecology

## 2018-11-25 MED ORDER — PROPRANOLOL HCL 10 MG PO TABS: 10.0000 mg | ORAL_TABLET | Freq: Every day | ORAL | 0 refills | Status: DC | PRN

## 2018-12-18 ENCOUNTER — Other Ambulatory Visit: Payer: Self-pay | Admitting: Obstetrics and Gynecology

## 2018-12-20 NOTE — Telephone Encounter (Signed)
advise

## 2019-05-27 ENCOUNTER — Other Ambulatory Visit: Payer: Self-pay | Admitting: Obstetrics and Gynecology

## 2019-05-27 DIAGNOSIS — Z1231 Encounter for screening mammogram for malignant neoplasm of breast: Secondary | ICD-10-CM

## 2019-06-08 DIAGNOSIS — J019 Acute sinusitis, unspecified: Secondary | ICD-10-CM | POA: Diagnosis not present

## 2019-06-08 DIAGNOSIS — Z03818 Encounter for observation for suspected exposure to other biological agents ruled out: Secondary | ICD-10-CM | POA: Diagnosis not present

## 2019-06-22 ENCOUNTER — Other Ambulatory Visit: Payer: Self-pay | Admitting: Obstetrics and Gynecology

## 2019-06-22 DIAGNOSIS — N939 Abnormal uterine and vaginal bleeding, unspecified: Secondary | ICD-10-CM

## 2019-06-22 NOTE — Telephone Encounter (Signed)
Ultrasound and follow up next 1-2 weeks

## 2019-06-22 NOTE — Telephone Encounter (Signed)
Patient is schedule 07/04/19 for ultrasound and follow up with [facility]

## 2019-06-29 ENCOUNTER — Ambulatory Visit
Admission: RE | Admit: 2019-06-29 | Discharge: 2019-06-29 | Disposition: A | Discharge status: Home / Self Care | Payer: BC Managed Care – PPO | Source: Ambulatory Visit | Attending: Obstetrics and Gynecology | Admitting: Obstetrics and Gynecology

## 2019-06-29 DIAGNOSIS — Z1231 Encounter for screening mammogram for malignant neoplasm of breast: Secondary | ICD-10-CM | POA: Diagnosis not present

## 2019-07-04 ENCOUNTER — Encounter: Payer: Self-pay | Admitting: Obstetrics and Gynecology

## 2019-07-04 ENCOUNTER — Ambulatory Visit (INDEPENDENT_AMBULATORY_CARE_PROVIDER_SITE_OTHER): Payer: BC Managed Care – PPO | Admitting: Obstetrics and Gynecology

## 2019-07-04 ENCOUNTER — Telehealth: Payer: Self-pay

## 2019-07-04 ENCOUNTER — Other Ambulatory Visit: Payer: Self-pay

## 2019-07-04 ENCOUNTER — Ambulatory Visit (INDEPENDENT_AMBULATORY_CARE_PROVIDER_SITE_OTHER): Payer: BC Managed Care – PPO

## 2019-07-04 VITALS — BP 132/86 | HR 84 | Wt 284.0 lb

## 2019-07-04 DIAGNOSIS — N939 Abnormal uterine and vaginal bleeding, unspecified: Secondary | ICD-10-CM | POA: Diagnosis not present

## 2019-07-04 DIAGNOSIS — R102 Pelvic and perineal pain: Secondary | ICD-10-CM

## 2019-07-04 DIAGNOSIS — N809 Endometriosis, unspecified: Secondary | ICD-10-CM | POA: Diagnosis not present

## 2019-07-04 DIAGNOSIS — N949 Unspecified condition associated with female genital organs and menstrual cycle: Secondary | ICD-10-CM | POA: Diagnosis not present

## 2019-07-04 DIAGNOSIS — N83201 Unspecified ovarian cyst, right side: Secondary | ICD-10-CM

## 2019-07-04 MED ORDER — ORILISSA 150 MG PO TABS: 1.0000 | ORAL_TABLET | Freq: Every day | ORAL | 5 refills | Status: DC

## 2019-07-04 NOTE — Progress Notes (Signed)
Gynecology Ultrasound Follow Up  Chief Complaint:  Chief Complaint  Patient presents with  . Follow-up    GYN ultrasound     History of Present Illness: Patient is a 42 y.o. female who presents today for ultrasound evaluation of pelvic pain and abnormal uterine bleeding (heaviery menses with dymsnorrhea).  Ultrasound demonstrates the following findgins Adnexa:not visualized Uterus: Non-enlarged with endometrial stripe 19mm homogenous endometrial stripe Additional: ultrasonographer questions possibility of dermoid in right adnexa  Review of Systems: Review of Systems  Constitutional: Negative.   Gastrointestinal: Positive for abdominal pain.  Skin: Negative.     Past Medical History:  Past Medical History:  Diagnosis Date  . Borderline hypertension   . Gestational diabetes 2016    Past Surgical History:  Past Surgical History:  Procedure Laterality Date  . CESAREAN SECTION  2005  . CESAREAN SECTION N/A 10/10/2015   Procedure: CESAREAN SECTION;  Surgeon: Malachy Mood, MD;  Location: [facility] ORS;  Service: Obstetrics;  Laterality: N/A;  . DIAGNOSTIC LAPAROSCOPY  2015  . DILATION AND CURETTAGE OF UTERUS    . TUBAL LIGATION  2016    Gynecologic History:  Patient's last menstrual period was 07/03/2019. Contraception: tubal ligation Last Pap: 11/18/2016 Results were: .NIL HPV negative  Family History:  Family History  Problem Relation Age of Onset  . Hypertension Mother   . Hypertension Father   . Stroke Maternal Grandfather   . Breast cancer Paternal Aunt 65    Social History:  Social History   Socioeconomic History  . Marital status: Married    Spouse name: Not on file  . Number of children: Not on file  . Years of education: Not on file  . Highest education level: Not on file  Occupational History  . Not on file  Social Needs  . Financial resource strain: Not on file  . Food insecurity    Worry: Not on file    Inability: Not on file  .  Transportation needs    Medical: Not on file    Non-medical: Not on file  Tobacco Use  . Smoking status: Former Smoker    Packs/day: 0.50    Years: 15.00    Pack years: 7.50    Types: Cigarettes    Quit date: 10/21/2011    Years since quitting: 7.7  . Smokeless tobacco: Never Used  Substance and Sexual Activity  . Alcohol use: No    Alcohol/week: 0.0 standard drinks  . Drug use: No  . Sexual activity: Yes  Lifestyle  . Physical activity    Days per week: Not on file    Minutes per session: Not on file  . Stress: Not on file  Relationships  . Social Herbalist on phone: Not on file    Gets together: Not on file    Attends religious service: Not on file    Active member of club or organization: Not on file    Attends meetings of clubs or organizations: Not on file    Relationship status: Not on file  . Intimate partner violence    Fear of current or ex partner: Not on file    Emotionally abused: Not on file    Physically abused: Not on file    Forced sexual activity: Not on file  Other Topics Concern  . Not on file  Social History Narrative  . Not on file    Allergies:  No Known Allergies  Medications: Prior to Admission medications  Medication Sig Start Date End Date Taking? Authorizing Provider  escitalopram (LEXAPRO) 20 MG tablet Take 1 tablet (20 mg total) by mouth daily. 10/25/18  Yes Vena AustriaStaebler, Tatsuo Musial, MD  traZODone (DESYREL) 50 MG tablet Take 1 tablet (50 mg total) by mouth at bedtime as needed for sleep. 10/25/18  Yes Vena AustriaStaebler, Davine Coba, MD    Physical Exam Vitals: Blood pressure 132/86, pulse 84, weight 284 lb (128.8 kg), last menstrual period 07/03/2019.  General: NAD HEENT: normocephalic, anicteric Pulmonary: No increased work of breathing Extremities: no edema, erythema, or tenderness Neurologic: Grossly intact, normal gait Psychiatric: mood appropriate, affect full  [place] Transvaginal Non-ob  Result Date: 07/04/2019 Patient Name: Lindsey RandyJamie S  Bell DOB: 04/10/1976 MRN: [id number] ULTRASOUND REPORT Location: [facility] Date of Service: 07/04/2019 Indications:Abnormal Uterine Bleeding and pain Findings: The uterus is anteverted and measures 8.2 x 3.7 x 3.8 cm. Echo texture is homogenous without evidence of focal masses. The Endometrium measures 6.0 mm. Right Ovary is not visualized. Left Ovary is not visualized. Survey of the adnexa demonstrates no adnexal masses. There is no free fluid in the cul de sac. Impression: 1. Limited visualized throughout the pelvis. 2. No obvious uterine fibroids. 3. Neither ovary is visible. 4. There is a questionable dermoid vs. Bowel in the right adnexa measuring approximately 6.5 x 5.3 cm Recommendations: Correlation with the patient's History and Physical Exam. Deanna ArtisElyse S Fairbanks, RT Normal uterus with homogenous endometrial stipe.  Imaging of ovaries suboptimal.  Unclear as to what was visualized by sonographer in the right adnexa.  Would recommend additional imaging to rule out dermoid. Vena AustriaAndreas Linh Johannes, MD, Merlinda FrederickFACOG [facility], [facility] 07/04/2019, 5:36 PM     Mm 3d Screen Breast Bilateral  Result Date: 06/29/2019 CLINICAL DATA:  Screening. EXAM: DIGITAL SCREENING BILATERAL MAMMOGRAM WITH TOMO AND CAD COMPARISON:  Previous exam(s). ACR Breast Density Category a: The breast tissue is almost entirely fatty. FINDINGS: There are no findings suspicious for malignancy. Images were processed with CAD. IMPRESSION: No mammographic evidence of malignancy. A result letter of this screening mammogram will be mailed directly to the patient. RECOMMENDATION: Screening mammogram in one year. (Code:SM-B-01Y) BI-RADS CATEGORY  1: Negative. Electronically Signed   By: Amie Portlandavid  Ormond M.D.   On: 06/29/2019 11:17     Assessment: 43 y.o. G3P2010 No problem-specific Assessment & Plan notes found for this encounter.   Plan: Problem List Items Addressed This Visit      Other   Endometriosis determined by  laparoscopy    Other Visit Diagnoses    Pelvic pain    -  Primary   Right ovarian cyst       Unspecified condition associated with female genital organs and menstrual cycle       Relevant Orders   MR PELVIS W WO CONTRAST      1) Trial of orilissa follow up CT scan pelvis.  Ovaries not visualized but ultrasonographer questioned possibility of right dermoid.  Unclear in uploaded imaging.  Given that her pain is similar to her prior endometriosis pain we discussed trial of orilissa.  We discussed additional imaging vs laparoscopy to evaluate the right adnexa and the current state of her endometriosis. - start orilissa - pelvic MRI  2) A total of 15 minutes were spent in face-to-face contact with the patient during this encounter with over half of that time devoted to counseling and coordination of care.  3) Return in about 6 weeks (around 08/15/2019) for medication follow up.    Vena AustriaAndreas Seraj Dunnam,  MD, [facility], [facility]

## 2019-07-04 NOTE — Telephone Encounter (Signed)
Pt calling; has appt later today for vaginal u/s c f/u c AMS.  She started her period yesterday.  Should she keep her appts?  [phone]

## 2019-07-04 NOTE — Telephone Encounter (Signed)
Pt aware.

## 2019-07-04 NOTE — Telephone Encounter (Signed)
Please make pt aware she can keep appointment as long as she is not having heavy heavy bleeding. Thanks

## 2019-07-19 ENCOUNTER — Ambulatory Visit: Payer: BC Managed Care – PPO

## 2019-07-27 ENCOUNTER — Ambulatory Visit: Payer: BC Managed Care – PPO

## 2019-07-28 ENCOUNTER — Telehealth: Payer: Self-pay

## 2019-07-28 NOTE — Telephone Encounter (Signed)
Kenney Houseman, do you handle getting all this information? I am not sure who handled her referral

## 2019-07-28 NOTE — Telephone Encounter (Signed)
Debbie, RN from [employer] at [employer], calling re MRI of pelvis that has been denied.  There will NOT be a peer to peer review but they will do a provider courtesy review.  They need all office notes, labs, texts, tests, x-rays, ultrasounds that were done prior to asking for MRI.  AMS may also include a medical necessity letter.  All of this is DUE BEFORE TUES., 08/02/19.  Fax # [phone]  Send to: ATTN Appeals, include pt's name, DOB, and ins info.  If you have questions you may call Debbie at [phone] ext [phone].

## 2019-07-29 ENCOUNTER — Other Ambulatory Visit: Payer: Self-pay | Admitting: Obstetrics and Gynecology

## 2019-07-29 MED ORDER — VENLAFAXINE HCL ER 75 MG PO CP24: 75.0000 mg | ORAL_CAPSULE | Freq: Every day | ORAL | 3 refills | Status: DC

## 2019-07-29 NOTE — Telephone Encounter (Signed)
Medication follow up 2 weeks phone or in person ok

## 2019-07-29 NOTE — Progress Notes (Signed)
PHQ9 18 and GAD-7 18, discontinue lexapro and start effexor 75 follow up 1-2 weeks to assess response

## 2019-08-01 NOTE — Telephone Encounter (Signed)
Patient is schedule 08/16/19

## 2019-08-02 ENCOUNTER — Other Ambulatory Visit: Payer: Self-pay | Admitting: Obstetrics and Gynecology

## 2019-08-02 DIAGNOSIS — N83209 Unspecified ovarian cyst, unspecified side: Secondary | ICD-10-CM

## 2019-08-02 DIAGNOSIS — N949 Unspecified condition associated with female genital organs and menstrual cycle: Secondary | ICD-10-CM

## 2019-08-03 NOTE — Telephone Encounter (Signed)
We received a denial, and Dr. Georgianne Fick has ordered a CT instead.

## 2019-08-09 ENCOUNTER — Other Ambulatory Visit: Payer: Self-pay

## 2019-08-09 ENCOUNTER — Ambulatory Visit
Admission: RE | Admit: 2019-08-09 | Discharge: 2019-08-09 | Disposition: A | Discharge status: Home / Self Care | Payer: BC Managed Care – PPO | Source: Ambulatory Visit | Attending: Obstetrics and Gynecology | Admitting: Obstetrics and Gynecology

## 2019-08-09 ENCOUNTER — Ambulatory Visit: Payer: BC Managed Care – PPO

## 2019-08-09 DIAGNOSIS — R1909 Other intra-abdominal and pelvic swelling, mass and lump: Secondary | ICD-10-CM | POA: Diagnosis not present

## 2019-08-09 DIAGNOSIS — N949 Unspecified condition associated with female genital organs and menstrual cycle: Secondary | ICD-10-CM | POA: Diagnosis not present

## 2019-08-09 MED ORDER — IOHEXOL 300 MG/ML  SOLN
100.0000 mL | Freq: Once | INTRAMUSCULAR | Status: AC | PRN
  Administered 2019-08-09: 10:00:00 100 mL via INTRAVENOUS

## 2019-08-16 ENCOUNTER — Other Ambulatory Visit: Payer: Self-pay

## 2019-08-16 ENCOUNTER — Ambulatory Visit (INDEPENDENT_AMBULATORY_CARE_PROVIDER_SITE_OTHER): Payer: BC Managed Care – PPO | Admitting: Obstetrics and Gynecology

## 2019-08-16 DIAGNOSIS — F419 Anxiety disorder, unspecified: Secondary | ICD-10-CM | POA: Diagnosis not present

## 2019-08-16 DIAGNOSIS — N809 Endometriosis, unspecified: Secondary | ICD-10-CM | POA: Diagnosis not present

## 2019-08-16 DIAGNOSIS — F329 Major depressive disorder, single episode, unspecified: Secondary | ICD-10-CM

## 2019-08-16 MED ORDER — VENLAFAXINE HCL ER 150 MG PO CP24: 150.0000 mg | ORAL_CAPSULE | Freq: Every day | ORAL | 2 refills | Status: DC

## 2019-08-16 NOTE — Progress Notes (Signed)
I connected with Lindsey Bell on 08/16/19 at  9:10 AM EDT by telephone and verified that I am speaking with the correct person using two identifiers.   I discussed the limitations, risks, security and privacy concerns of performing an evaluation and management service by telephone and the availability of in person appointments. I also discussed with the patient that there may be a patient responsible charge related to this service. The patient expressed understanding and agreed to proceed.  The patient was at home I spoke with the patient from my workstation phone The names of people involved in this encounter were: Lindsey Bell , and Lindsey Bell   Obstetrics & Gynecology Office Visit   Chief Complaint: No chief complaint on file.   History of Present Illness: 43 y.o. G3P2010 presenting for medication follow up for a diagnosis of laparoscopically proven endometriosis and anxiety/depression.  She is currently being managed with Dewayne Hatch 150mg  po daily and Effexor XR 75mg  po daily.   The patient reports good control of symptoms on her current regimen.  On her current medication regimen she reports significantly less cramping with her last menstrual cycle.  Moods have favorably improved, she is sleeping better, still notices irritability.   She has not noted any side-effects or new symptoms.    Review of Systems: reviews of systems negative unless otherwise noted in HPI  Past Medical History:  Past Medical History:  Diagnosis Date  . Borderline hypertension   . Gestational diabetes 2016    Past Surgical History:  Past Surgical History:  Procedure Laterality Date  . CESAREAN SECTION  2005  . CESAREAN SECTION N/A 10/10/2015   Procedure: CESAREAN SECTION;  Surgeon: 2006, MD;  Location: [facility] ORS;  Service: Obstetrics;  Laterality: N/A;  . DIAGNOSTIC LAPAROSCOPY  2015  . DILATION AND CURETTAGE OF UTERUS    . TUBAL LIGATION  2016    Gynecologic History: No LMP  recorded.  Obstetric History: G3P2010  Family History:  Family History  Problem Relation Age of Onset  . Hypertension Mother   . Hypertension Father   . Stroke Maternal Grandfather   . Breast cancer Paternal Aunt 63    Social History:  Social History   Socioeconomic History  . Marital status: Married    Spouse name: Not on file  . Number of children: Not on file  . Years of education: Not on file  . Highest education level: Not on file  Occupational History  . Not on file  Social Needs  . Financial resource strain: Not on file  . Food insecurity    Worry: Not on file    Inability: Not on file  . Transportation needs    Medical: Not on file    Non-medical: Not on file  Tobacco Use  . Smoking status: Former Smoker    Packs/day: 0.50    Years: 15.00    Pack years: 7.50    Types: Cigarettes    Quit date: 10/21/2011    Years since quitting: 7.8  . Smokeless tobacco: Never Used  Substance and Sexual Activity  . Alcohol use: No    Alcohol/week: 0.0 standard drinks  . Drug use: No  . Sexual activity: Yes  Lifestyle  . Physical activity    Days per week: Not on file    Minutes per session: Not on file  . Stress: Not on file  Relationships  . Social connections    Talks on phone: Not on file  Gets together: Not on file    Attends religious service: Not on file    Active member of club or organization: Not on file    Attends meetings of clubs or organizations: Not on file    Relationship status: Not on file  . Intimate partner violence    Fear of current or ex partner: Not on file    Emotionally abused: Not on file    Physically abused: Not on file    Forced sexual activity: Not on file  Other Topics Concern  . Not on file  Social History Narrative  . Not on file    Allergies:  No Known Allergies  Medications: Prior to Admission medications   Medication Sig Start Date End Date Taking? Authorizing Provider  Elagolix Sodium (ORILISSA) 150 MG TABS Take 1  tablet by mouth daily. 07/04/19   Malachy Mood, MD  traZODone (DESYREL) 50 MG tablet Take 1 tablet (50 mg total) by mouth at bedtime as needed for sleep. 10/25/18   Malachy Mood, MD  venlafaxine XR (EFFEXOR-XR) 75 MG 24 hr capsule Take 1 capsule (75 mg total) by mouth daily. 07/29/19   Malachy Mood, MD    Physical Exam Vitals: There were no vitals filed for this visit. No LMP recorded.  No physical exam as this was a remote telephone visit to promote social distancing during the current COVID-19 Pandemic  Assessment: 43 y.o. G3P2010 follow up anxiety and depression  Plan: Problem List Items Addressed This Visit      Other   Endometriosis determined by laparoscopy    Other Visit Diagnoses    Anxiety and depression    -  Primary   Relevant Medications   venlafaxine XR (EFFEXOR-XR) 150 MG 24 hr capsule      1) Anxiety and depression - overall good improvement in symptoms.  No side-effects.  Increase Effexor to 150mg  sleeping better, but issues with irritability still  2) Endometriosis - Continue orilissa good improvement  3) Telephone Time 5:44 minutes  4) Return in about 4 weeks (around 09/13/2019) for telephone follow up.    Malachy Mood, MD, [facility], [facility] 08/16/2019, 9:48 AM

## 2019-08-26 ENCOUNTER — Other Ambulatory Visit: Payer: Self-pay | Admitting: *Deleted

## 2019-08-26 DIAGNOSIS — Z20822 Contact with and (suspected) exposure to covid-19: Secondary | ICD-10-CM

## 2019-08-27 LAB — NOVEL CORONAVIRUS, NAA: SARS-CoV-2, NAA: NOT DETECTED

## 2019-09-14 ENCOUNTER — Ambulatory Visit (INDEPENDENT_AMBULATORY_CARE_PROVIDER_SITE_OTHER): Payer: BC Managed Care – PPO | Admitting: Obstetrics and Gynecology

## 2019-09-14 ENCOUNTER — Other Ambulatory Visit: Payer: Self-pay

## 2019-09-14 ENCOUNTER — Encounter: Payer: Self-pay | Admitting: Obstetrics and Gynecology

## 2019-09-14 DIAGNOSIS — F411 Generalized anxiety disorder: Secondary | ICD-10-CM

## 2019-09-14 MED ORDER — HYDROXYZINE HCL 25 MG PO TABS: 25.0000 mg | ORAL_TABLET | Freq: Four times a day (QID) | ORAL | 2 refills | Status: DC | PRN

## 2019-09-14 NOTE — Progress Notes (Signed)
I connected with GLENNETTE GALSTER on 09/14/19 at  8:50 AM EST by telephone and verified that I am speaking with the correct person using two identifiers.   I discussed the limitations, risks, security and privacy concerns of performing an evaluation and management service by telephone and the availability of in person appointments. I also discussed with the patient that there may be a patient responsible charge related to this service. The patient expressed understanding and agreed to proceed.  The patient was at home I spoke with the patient from my workstation phone The names of people involved in this encounter were: Joneen Caraway , and [facility] Visit   Chief Complaint:  Chief Complaint  Patient presents with  . Follow-up    Anxiety/depression    History of Present Illness: The patient is a 43 y.o. female presenting follow up for symptoms of anxiety.  The patient is currently taking Effexor XR 150mg  for the management of her symptoms.  She has had any recent situational stressors.  She reports symptoms of irritability.  She denies anhedonia, day time somnolence, insomnia, risk taking behavior, social anxiety, agorophobia, feelings of guilt, feelings of worthlessness, suicidal ideation, homicidal ideation, auditory hallucinations and visual hallucinations. Symptoms have improved since last visit.     The patient does have a pre-existing history of depression and anxiety.  She  does not a prior history of suicide attempts.  Review of Systems: Review of systems negative unless noted in HPI  Past Medical History:  Past Medical History:  Diagnosis Date  . Borderline hypertension   . Gestational diabetes 2016    Past Surgical History:  Past Surgical History:  Procedure Laterality Date  . CESAREAN SECTION  2005  . CESAREAN SECTION N/A 10/10/2015   Procedure: CESAREAN SECTION;  Surgeon: Malachy Mood, MD;  Location: [facility] ORS;  Service:  Obstetrics;  Laterality: N/A;  . DIAGNOSTIC LAPAROSCOPY  2015  . DILATION AND CURETTAGE OF UTERUS    . TUBAL LIGATION  2016    Gynecologic History: No LMP recorded.  Obstetric History: G3P2010  Family History:  Family History  Problem Relation Age of Onset  . Hypertension Mother   . Hypertension Father   . Stroke Maternal Grandfather   . Breast cancer Paternal Aunt 75    Social History:  Social History   Socioeconomic History  . Marital status: Married    Spouse name: Not on file  . Number of children: Not on file  . Years of education: Not on file  . Highest education level: Not on file  Occupational History  . Not on file  Social Needs  . Financial resource strain: Not on file  . Food insecurity    Worry: Not on file    Inability: Not on file  . Transportation needs    Medical: Not on file    Non-medical: Not on file  Tobacco Use  . Smoking status: Former Smoker    Packs/day: 0.50    Years: 15.00    Pack years: 7.50    Types: Cigarettes    Quit date: 10/21/2011    Years since quitting: 7.9  . Smokeless tobacco: Never Used  Substance and Sexual Activity  . Alcohol use: No    Alcohol/week: 0.0 standard drinks  . Drug use: No  . Sexual activity: Yes  Lifestyle  . Physical activity    Days per week: Not on file    Minutes per session: Not on  file  . Stress: Not on file  Relationships  . Social Musician on phone: Not on file    Gets together: Not on file    Attends religious service: Not on file    Active member of club or organization: Not on file    Attends meetings of clubs or organizations: Not on file    Relationship status: Not on file  . Intimate partner violence    Fear of current or ex partner: Not on file    Emotionally abused: Not on file    Physically abused: Not on file    Forced sexual activity: Not on file  Other Topics Concern  . Not on file  Social History Narrative  . Not on file    Allergies:  No Known Allergies   Medications: Prior to Admission medications   Medication Sig Start Date End Date Taking? Authorizing Provider  Elagolix Sodium (ORILISSA) 150 MG TABS Take 1 tablet by mouth daily. 07/04/19  Yes [name], MD  traZODone (DESYREL) 50 MG tablet Take 1 tablet (50 mg total) by mouth at bedtime as needed for sleep. 10/25/18  Yes [name], MD  venlafaxine XR (EFFEXOR-XR) 150 MG 24 hr capsule Take 1 capsule (150 mg total) by mouth daily. 08/16/19  Yes [name], MD    Physical Exam Vitals: There were no vitals filed for this visit. No LMP recorded.  No physical exam as this was a remote telephone visit to promote social distancing during the current COVID-19 Pandemic   GAD 7 : Generalized Anxiety Score 09/14/2019 10/25/2018 08/26/2018  Nervous, Anxious, on Edge 1 1 3   Control/stop worrying 0 1 3  Worry too much - different things 0 1 3  Trouble relaxing 1 1 3   Restless 1 1 3   Easily annoyed or irritable 3 1 3   Afraid - awful might happen 0 0 3  Total GAD 7 Score 6 6 21   Anxiety Difficulty - Somewhat difficult Very difficult    Depression screen [facility] 2/9 09/14/2019 10/25/2018 04/16/2015  Decreased Interest 0 0 0  Down, Depressed, Hopeless 0 0 0  PHQ - 2 Score 0 0 0  Altered sleeping - 2 -  Tired, decreased energy - 2 -  Change in appetite - 1 -  Feeling bad or failure about yourself  - 0 -  Trouble concentrating - 0 -  Moving slowly or fidgety/restless - 0 -  Suicidal thoughts - 0 -  PHQ-9 Score - 5 -  Difficult doing work/chores - Somewhat difficult -    Depression screen [facility] 2/9 09/14/2019 10/25/2018 04/16/2015  Decreased Interest 0 0 0  Down, Depressed, Hopeless 0 0 0  PHQ - 2 Score 0 0 0  Altered sleeping - 2 -  Tired, decreased energy - 2 -  Change in appetite - 1 -  Feeling bad or failure about yourself  - 0 -  Trouble concentrating - 0 -  Moving slowly or fidgety/restless - 0 -  Suicidal thoughts - 0 -  PHQ-9 Score - 5 -  Difficult doing work/chores -  Somewhat difficult -     Assessment: 43 y.o. G3P2010   Plan: Problem List Items Addressed This Visit    None    Visit Diagnoses    Generalized anxiety disorder    -  Primary   Relevant Medications   hydrOXYzine (ATARAX/VISTARIL) 25 MG tablet      1) Anxiety - improvement but still irritability at time.  Add  vistaril prn  2) Thyroid and B12 screen has been obtained previously  3) Telephone Time 8:8152min   Vena AustriaAndreas Oriel Ojo, MD, Merlinda FrederickFACOG [facility], [facility] 09/14/2019, 8:58 AM

## 2019-09-19 ENCOUNTER — Other Ambulatory Visit: Payer: Self-pay | Admitting: Obstetrics and Gynecology

## 2019-09-19 MED ORDER — HYDROXYZINE HCL 10 MG PO TABS: 10.0000 mg | ORAL_TABLET | Freq: Four times a day (QID) | ORAL | 2 refills | Status: DC | PRN

## 2019-10-17 ENCOUNTER — Encounter: Payer: Self-pay | Admitting: Obstetrics and Gynecology

## 2019-10-17 ENCOUNTER — Ambulatory Visit (INDEPENDENT_AMBULATORY_CARE_PROVIDER_SITE_OTHER): Payer: BC Managed Care – PPO | Admitting: Obstetrics and Gynecology

## 2019-10-17 ENCOUNTER — Other Ambulatory Visit: Payer: Self-pay

## 2019-10-17 VITALS — Ht 65.5 in | Wt 265.0 lb

## 2019-10-17 DIAGNOSIS — F325 Major depressive disorder, single episode, in full remission: Secondary | ICD-10-CM

## 2019-10-17 DIAGNOSIS — F411 Generalized anxiety disorder: Secondary | ICD-10-CM

## 2019-10-17 MED ORDER — VENLAFAXINE HCL ER 75 MG PO CP24: 75.0000 mg | ORAL_CAPSULE | Freq: Every day | ORAL | 2 refills | Status: DC

## 2019-10-17 NOTE — Progress Notes (Signed)
I connected with Lindsey Bell on 10/17/19 at  8:10 AM EST by telephone and verified that I am speaking with the correct person using two identifiers.   I discussed the limitations, risks, security and privacy concerns of performing an evaluation and management service by telephone and the availability of in person appointments. I also discussed with the patient that there may be a patient responsible charge related to this service. The patient expressed understanding and agreed to proceed.  The patient was at home I spoke with the patient from my workstation phone The names of people involved in this encounter were: Lindsey Bell , and [facility] Office Visit   Chief Complaint:  Chief Complaint  Patient presents with  . Wound Check    History of Present Illness: The patient is a 43 y.o. female presenting follow up for symptoms of anxiety and depression.  The patient is currently taking Effexor XR 150mg   for the management of her symptoms.  She has not had any recent situational stressors.  She reports resolution of symptoms on her current dose of Effexor.  She denies anhedonia, day time somnolence, insomnia, risk taking behavior, irritability, increased appetite, decreased appetite, social anxiety, agorophobia, feelings of guilt, feelings of worthlessness, suicidal ideation, homicidal ideation, auditory hallucinations and visual hallucinations. Symptoms have improved since last visit.     The patient does have a pre-existing history of depression and anxiety.  She  does not a prior history of suicide attempts.  Patient has started noting paraesthesias bilateral upper extremities which she attributes to Effexor.    Review of Systems: review of systems negative unless noted in HPI  Past Medical History:  Past Medical History:  Diagnosis Date  . Borderline hypertension   . Gestational diabetes 2016    Past Surgical History:  Past Surgical  History:  Procedure Laterality Date  . CESAREAN SECTION  2005  . CESAREAN SECTION N/A 10/10/2015   Procedure: CESAREAN SECTION;  Surgeon: Malachy Mood, MD;  Location: [facility] ORS;  Service: Obstetrics;  Laterality: N/A;  . DIAGNOSTIC LAPAROSCOPY  2015  . DILATION AND CURETTAGE OF UTERUS    . TUBAL LIGATION  2016    Gynecologic History: Patient's last menstrual period was 10/02/2019 (exact date).  Obstetric History: G3P2010  Family History:  Family History  Problem Relation Age of Onset  . Hypertension Mother   . Hypertension Father   . Stroke Maternal Grandfather   . Breast cancer Paternal Aunt 15    Social History:  Social History   Socioeconomic History  . Marital status: Married    Spouse name: Not on file  . Number of children: Not on file  . Years of education: Not on file  . Highest education level: Not on file  Occupational History  . Not on file  Tobacco Use  . Smoking status: Former Smoker    Packs/day: 0.50    Years: 15.00    Pack years: 7.50    Types: Cigarettes    Quit date: 10/21/2011    Years since quitting: 7.9  . Smokeless tobacco: Never Used  Substance and Sexual Activity  . Alcohol use: No    Alcohol/week: 0.0 standard drinks  . Drug use: No  . Sexual activity: Yes  Other Topics Concern  . Not on file  Social History Narrative  . Not on file   Social Determinants of Health   Financial Resource Strain:   . Difficulty of Paying Living Expenses:  Not on file  Food Insecurity:   . Worried About Programme researcher, broadcasting/film/video in the Last Year: Not on file  . Ran Out of Food in the Last Year: Not on file  Transportation Needs:   . Lack of Transportation (Medical): Not on file  . Lack of Transportation (Non-Medical): Not on file  Physical Activity:   . Days of Exercise per Week: Not on file  . Minutes of Exercise per Session: Not on file  Stress:   . Feeling of Stress : Not on file  Social Connections:   . Frequency of Communication with Friends and  Family: Not on file  . Frequency of Social Gatherings with Friends and Family: Not on file  . Attends Religious Services: Not on file  . Active Member of Clubs or Organizations: Not on file  . Attends Banker Meetings: Not on file  . Marital Status: Not on file  Intimate Partner Violence:   . Fear of Current or Ex-Partner: Not on file  . Emotionally Abused: Not on file  . Physically Abused: Not on file  . Sexually Abused: Not on file    Allergies:  No Known Allergies  Medications: Prior to Admission medications   Medication Sig Start Date End Date Taking? Authorizing Provider  Elagolix Sodium (ORILISSA) 150 MG TABS Take 1 tablet by mouth daily. 07/04/19  Yes [name], MD  hydrOXYzine (ATARAX/VISTARIL) 10 MG tablet Take 1 tablet (10 mg total) by mouth every 6 (six) hours as needed for anxiety. 09/19/19  Yes [name], MD  traZODone (DESYREL) 50 MG tablet Take 1 tablet (50 mg total) by mouth at bedtime as needed for sleep. 10/25/18  Yes [name], MD  venlafaxine XR (EFFEXOR-XR) 150 MG 24 hr capsule Take 1 capsule (150 mg total) by mouth daily. 08/16/19  Yes [name], MD    Physical Exam Vitals: There were no vitals filed for this visit. Patient's last menstrual period was 10/02/2019 (exact date).  No physical exam as this was a remote telephone visit to promote social distancing during the current COVID-19 Pandemic    GAD 7 : Generalized Anxiety Score 10/17/2019 09/14/2019 10/25/2018 08/26/2018  Nervous, Anxious, on Edge 1 1 1 3   Control/stop worrying 0 0 1 3  Worry too much - different things 0 0 1 3  Trouble relaxing 1 1 1 3   Restless 1 1 1 3   Easily annoyed or irritable 1 3 1 3   Afraid - awful might happen 0 0 0 3  Total GAD 7 Score 4 6 6 21   Anxiety Difficulty Not difficult at all - Somewhat difficult Very difficult    Depression screen [facility] 2/9 10/17/2019 09/14/2019 10/25/2018  Decreased Interest 0 0 0  Down, Depressed, Hopeless  0 0 0  PHQ - 2 Score 0 0 0  Altered sleeping 0 - 2  Tired, decreased energy 1 - 2  Change in appetite 0 - 1  Feeling bad or failure about yourself  0 - 0  Trouble concentrating 0 - 0  Moving slowly or fidgety/restless 0 - 0  Suicidal thoughts 0 - 0  PHQ-9 Score 1 - 5  Difficult doing work/chores Not difficult at all - Somewhat difficult    Depression screen [facility] 2/9 10/17/2019 09/14/2019 10/25/2018 04/16/2015  Decreased Interest 0 0 0 0  Down, Depressed, Hopeless 0 0 0 0  PHQ - 2 Score 0 0 0 0  Altered sleeping 0 - 2 -  Tired, decreased energy 1 -  2 -  Change in appetite 0 - 1 -  Feeling bad or failure about yourself  0 - 0 -  Trouble concentrating 0 - 0 -  Moving slowly or fidgety/restless 0 - 0 -  Suicidal thoughts 0 - 0 -  PHQ-9 Score 1 - 5 -  Difficult doing work/chores Not difficult at all - Somewhat difficult -     Assessment: 43 y.o. G3P2010 follow up generalized anxiety  Plan: Problem List Items Addressed This Visit    None    Visit Diagnoses    Generalized anxiety disorder    -  Primary   Relevant Medications   venlafaxine XR (EFFEXOR-XR) 75 MG 24 hr capsule   Major depressive disorder with single episode, in full remission (HCC)       Relevant Medications   venlafaxine XR (EFFEXOR-XR) 75 MG 24 hr capsule      1) Parasthesias - decrease Effexor XR to 75mg , follow up in 4 week to assess control of symptoms on this lower dose and to see if improvement in paraesthesia noted   2) Thyroid and B12 screen has not been obtained previously  3) Telephone Time 8:16 minutes  4) Return in about 4 weeks (around 11/14/2019) for medication follow up.     Vena AustriaAndreas Keajah Killough, MD, Evern CoreFACOG [facility], [facility] 10/17/2019, 8:18 AM

## 2019-10-24 ENCOUNTER — Other Ambulatory Visit: Payer: Self-pay | Admitting: Obstetrics and Gynecology

## 2019-10-24 MED ORDER — DULOXETINE HCL 20 MG PO CPEP: 20.0000 mg | ORAL_CAPSULE | Freq: Every day | ORAL | 2 refills | Status: DC

## 2019-10-24 NOTE — Progress Notes (Signed)
paraesthesia on Effexor discontinue and trial Cymbalta

## 2019-11-08 ENCOUNTER — Other Ambulatory Visit: Payer: Self-pay | Admitting: Obstetrics and Gynecology

## 2019-11-08 MED ORDER — CELECOXIB 100 MG PO CAPS: 100.0000 mg | ORAL_CAPSULE | Freq: Two times a day (BID) | ORAL | 11 refills | Status: DC

## 2019-11-15 ENCOUNTER — Encounter: Payer: Self-pay | Admitting: Obstetrics and Gynecology

## 2019-11-15 ENCOUNTER — Ambulatory Visit (INDEPENDENT_AMBULATORY_CARE_PROVIDER_SITE_OTHER): Payer: BC Managed Care – PPO | Admitting: Obstetrics and Gynecology

## 2019-11-15 ENCOUNTER — Other Ambulatory Visit: Payer: Self-pay

## 2019-11-15 DIAGNOSIS — F411 Generalized anxiety disorder: Secondary | ICD-10-CM | POA: Diagnosis not present

## 2019-11-15 DIAGNOSIS — G5603 Carpal tunnel syndrome, bilateral upper limbs: Secondary | ICD-10-CM | POA: Diagnosis not present

## 2019-11-15 MED ORDER — DULOXETINE HCL 20 MG PO CPEP: 20.0000 mg | ORAL_CAPSULE | Freq: Every day | ORAL | 3 refills | Status: DC

## 2019-11-15 NOTE — Progress Notes (Signed)
I connected with Lindsey Bell on 11/15/19 at  8:10 AM EST by telephone and verified that I am speaking with the correct person using two identifiers.   I discussed the limitations, risks, security and privacy concerns of performing an evaluation and management service by telephone and the availability of in person appointments. I also discussed with the patient that there may be a patient responsible charge related to this service. The patient expressed understanding and agreed to proceed.  The patient was at home I spoke with the patient from my workstation phone The names of people involved in this encounter were: Lindsey Bell , and [name]   Obstetrics & Gynecology Office Visit   Chief Complaint:  Chief Complaint  Patient presents with  . Follow-up    anxiety depression    History of Present Illness: The patient is a 44 y.o. female presenting follow up for symptoms of anxiety and depression.  The patient is currently taking cymbalta 20mg  for the management of her symptoms.  She has not had any recent situational stressors.  She reports resolution of her anxiety depression symptoms on the current dose of cymbalta.  The patient does have a pre-existing history of depression and anxiety.  She  does not a prior history of suicide attempts.    The patient also reports bilateral hand numbness, right (dominant hand) more so than left.  She does spend a fair bit of time typing on the computer.  She feels numbness is worse in the morning.  She has also noted some pain in both hands.  She was started on celebrex for this with some improvement.    Review of Systems: Review of Systems  Constitutional: Negative.   Skin: Negative.   Neurological: Positive for tingling and sensory change.  Psychiatric/Behavioral: Negative.      Past Medical History:  Past Medical History:  Diagnosis Date  . Borderline hypertension   . Gestational diabetes 2016    Past Surgical History:   Past Surgical History:  Procedure Laterality Date  . CESAREAN SECTION  2005  . CESAREAN SECTION N/A 10/10/2015   Procedure: CESAREAN SECTION;  Surgeon: 10/12/2015, MD;  Location: [facility] ORS;  Service: Obstetrics;  Laterality: N/A;  . DIAGNOSTIC LAPAROSCOPY  2015  . DILATION AND CURETTAGE OF UTERUS    . TUBAL LIGATION  2016    Gynecologic History: No LMP recorded.  Obstetric History: G3P2010  Family History:  Family History  Problem Relation Age of Onset  . Hypertension Mother   . Hypertension Father   . Stroke Maternal Grandfather   . Breast cancer Paternal Aunt 10    Social History:  Social History   Socioeconomic History  . Marital status: Married    Spouse name: Not on file  . Number of children: Not on file  . Years of education: Not on file  . Highest education level: Not on file  Occupational History  . Not on file  Tobacco Use  . Smoking status: Former Smoker    Packs/day: 0.50    Years: 15.00    Pack years: 7.50    Types: Cigarettes    Quit date: 10/21/2011    Years since quitting: 8.0  . Smokeless tobacco: Never Used  Substance and Sexual Activity  . Alcohol use: No    Alcohol/week: 0.0 standard drinks  . Drug use: No  . Sexual activity: Yes  Other Topics Concern  . Not on file  Social History Narrative  . Not  on file   Social Determinants of Health   Financial Resource Strain:   . Difficulty of Paying Living Expenses: Not on file  Food Insecurity:   . Worried About Charity fundraiser in the Last Year: Not on file  . Ran Out of Food in the Last Year: Not on file  Transportation Needs:   . Lack of Transportation (Medical): Not on file  . Lack of Transportation (Non-Medical): Not on file  Physical Activity:   . Days of Exercise per Week: Not on file  . Minutes of Exercise per Session: Not on file  Stress:   . Feeling of Stress : Not on file  Social Connections:   . Frequency of Communication with Friends and Family: Not on file  .  Frequency of Social Gatherings with Friends and Family: Not on file  . Attends Religious Services: Not on file  . Active Member of Clubs or Organizations: Not on file  . Attends Archivist Meetings: Not on file  . Marital Status: Not on file  Intimate Partner Violence:   . Fear of Current or Ex-Partner: Not on file  . Emotionally Abused: Not on file  . Physically Abused: Not on file  . Sexually Abused: Not on file    Allergies:  No Known Allergies  Medications: Prior to Admission medications   Medication Sig Start Date End Date Taking? Authorizing Provider  celecoxib (CELEBREX) 100 MG capsule Take 1 capsule (100 mg total) by mouth 2 (two) times daily. 11/08/19  Yes Malachy Mood, MD  DULoxetine (CYMBALTA) 20 MG capsule Take 1 capsule (20 mg total) by mouth daily. 10/24/19 10/23/20 Yes Malachy Mood, MD  Elagolix Sodium (ORILISSA) 150 MG TABS Take 1 tablet by mouth daily. 07/04/19  Yes Malachy Mood, MD  hydrOXYzine (ATARAX/VISTARIL) 10 MG tablet Take 1 tablet (10 mg total) by mouth every 6 (six) hours as needed for anxiety. 09/19/19  Yes Malachy Mood, MD  traZODone (DESYREL) 50 MG tablet Take 1 tablet (50 mg total) by mouth at bedtime as needed for sleep. 10/25/18  Yes Malachy Mood, MD    Physical Exam Vitals: There were no vitals filed for this visit. No LMP recorded.  No physical exam as this was a remote telephone visit to promote social distancing during the current COVID-19 Pandemic   GAD 7 : Generalized Anxiety Score 11/15/2019 10/17/2019 09/14/2019 10/25/2018  Nervous, Anxious, on Edge 2 1 1 1   Control/stop worrying 1 0 0 1  Worry too much - different things 1 0 0 1  Trouble relaxing 1 1 1 1   Restless 0 1 1 1   Easily annoyed or irritable 2 1 3 1   Afraid - awful might happen 0 0 0 0  Total GAD 7 Score 7 4 6 6   Anxiety Difficulty Not difficult at all Not difficult at all - Somewhat difficult    Depression screen [facility] 2/9 11/15/2019 10/17/2019  09/14/2019  Decreased Interest 0 0 0  Down, Depressed, Hopeless 0 0 0  PHQ - 2 Score 0 0 0  Altered sleeping 1 0 -  Tired, decreased energy 1 1 -  Change in appetite 2 0 -  Feeling bad or failure about yourself  0 0 -  Trouble concentrating 0 0 -  Moving slowly or fidgety/restless 0 0 -  Suicidal thoughts 0 0 -  PHQ-9 Score 4 1 -  Difficult doing work/chores Not difficult at all Not difficult at all -    Depression screen [facility] 2/9 11/15/2019  10/17/2019 09/14/2019 10/25/2018 04/16/2015  Decreased Interest 0 0 0 0 0  Down, Depressed, Hopeless 0 0 0 0 0  PHQ - 2 Score 0 0 0 0 0  Altered sleeping 1 0 - 2 -  Tired, decreased energy 1 1 - 2 -  Change in appetite 2 0 - 1 -  Feeling bad or failure about yourself  0 0 - 0 -  Trouble concentrating 0 0 - 0 -  Moving slowly or fidgety/restless 0 0 - 0 -  Suicidal thoughts 0 0 - 0 -  PHQ-9 Score 4 1 - 5 -  Difficult doing work/chores Not difficult at all Not difficult at all - Somewhat difficult -     Assessment: 44 y.o. G3P2010 follow up anxiety and depression  Plan: Problem List Items Addressed This Visit    None    Visit Diagnoses    Generalized anxiety disorder    -  Primary   Relevant Medications   DULoxetine (CYMBALTA) 20 MG capsule   Bilateral carpal tunnel syndrome       Relevant Medications   DULoxetine (CYMBALTA) 20 MG capsule   Other Relevant Orders   AMB referral to orthopedics      1) Anxiety/depression - continue cymbalta at 20mg  daily dose  2) Thyroid and B12 screen has not been obtained previously  3) Carpel tunnel - referral to orthopedics.  May continue celebrex for now  4) Telephone time 7:30 minutes  5) Return in about 8 months (around 07/15/2020) for annual.  07/17/2020, MD, [name] [facility], [facility] 11/15/2019, 8:24 AM

## 2019-11-21 NOTE — Telephone Encounter (Signed)
Did this referral go through?

## 2020-01-09 ENCOUNTER — Other Ambulatory Visit: Payer: Self-pay | Admitting: Obstetrics and Gynecology

## 2020-01-09 ENCOUNTER — Telehealth: Payer: Self-pay

## 2020-01-09 MED ORDER — ORILISSA 150 MG PO TABS: 1.0000 | ORAL_TABLET | Freq: Every day | ORAL | 11 refills | Status: DC

## 2020-01-09 NOTE — Telephone Encounter (Signed)
[facility] sent over a request of refill for the [facility]. Please advise

## 2020-05-31 ENCOUNTER — Other Ambulatory Visit: Payer: Self-pay | Admitting: Obstetrics and Gynecology

## 2020-05-31 DIAGNOSIS — Z1231 Encounter for screening mammogram for malignant neoplasm of breast: Secondary | ICD-10-CM

## 2020-06-29 ENCOUNTER — Ambulatory Visit
Admission: RE | Admit: 2020-06-29 | Discharge: 2020-06-29 | Disposition: A | Discharge status: Home / Self Care | Payer: BC Managed Care – PPO | Source: Ambulatory Visit | Attending: Obstetrics and Gynecology | Admitting: Obstetrics and Gynecology

## 2020-06-29 DIAGNOSIS — Z1231 Encounter for screening mammogram for malignant neoplasm of breast: Secondary | ICD-10-CM | POA: Diagnosis present

## 2020-08-03 ENCOUNTER — Other Ambulatory Visit: Payer: Self-pay

## 2020-08-03 ENCOUNTER — Ambulatory Visit
Admission: EM | Admit: 2020-08-03 | Discharge: 2020-08-03 | Disposition: A | Discharge status: Home / Self Care | Payer: BC Managed Care – PPO | Attending: Emergency Medicine | Admitting: Emergency Medicine

## 2020-08-03 ENCOUNTER — Telehealth: Payer: Self-pay | Admitting: Emergency Medicine

## 2020-08-03 DIAGNOSIS — Z20822 Contact with and (suspected) exposure to covid-19: Secondary | ICD-10-CM

## 2020-08-03 DIAGNOSIS — Z1152 Encounter for screening for COVID-19: Secondary | ICD-10-CM

## 2020-08-03 DIAGNOSIS — J069 Acute upper respiratory infection, unspecified: Secondary | ICD-10-CM

## 2020-08-03 DIAGNOSIS — J019 Acute sinusitis, unspecified: Secondary | ICD-10-CM

## 2020-08-03 MED ORDER — AMOXICILLIN-POT CLAVULANATE 875-125 MG PO TABS: 1.0000 | ORAL_TABLET | Freq: Two times a day (BID) | ORAL | 0 refills | Status: DC

## 2020-08-03 MED ORDER — AMOXICILLIN-POT CLAVULANATE 875-125 MG PO TABS: 1.0000 | ORAL_TABLET | Freq: Two times a day (BID) | ORAL | 0 refills | Status: AC

## 2020-08-03 MED ORDER — BENZONATATE 100 MG PO CAPS: 100.0000 mg | ORAL_CAPSULE | Freq: Three times a day (TID) | ORAL | 0 refills | Status: DC

## 2020-08-03 NOTE — Telephone Encounter (Signed)
Medication not a pharmacy

## 2020-08-03 NOTE — Discharge Instructions (Addendum)
COVID testing ordered.  It will take between 5-7 days for test results.  Someone will contact you regarding abnormal results.    In the meantime: You should remain isolated in your home for 10 days from symptom onset AND greater than 72 hours after symptoms resolution (absence of fever without the use of fever-reducing medication and improvement in respiratory symptoms), whichever is longer Get plenty of rest and push fluids Use OTC zyrtec for nasal congestion, runny nose, and/or sore throat Use OTC flonase for nasal congestion and runny nose Use medications daily for symptom relief Use OTC medications like ibuprofen or tylenol as needed fever or pain Call or go to the ED if you have any new or worsening symptoms such as fever, worsening cough, shortness of breath, chest tightness, chest pain, turning blue, changes in mental status, etc...  Tessalon for cough

## 2020-08-03 NOTE — ED Triage Notes (Signed)
Pt presents with c/o nasal congestion and fever that began on Monday fever of 101 this morning

## 2020-08-03 NOTE — ED Provider Notes (Signed)
[facility]   [id number] 08/03/20 Arrival Time: 2458   CC: COVID symptoms  SUBJECTIVE: History from: patient.  Lindsey Bell is a 44 y.o. female who presents with nasal congestion, maxillary sinus pain/ pressure, cough, and fever, tmax of 101, x 4 days.  Denies sick exposure to COVID, flu or strep.  Has tried OTC medications without relief.  Denies aggravating factors.  Reports previous symptoms in the past.  Denies previous COVID infection.  Received COVID vaccines.  Denies sore throat, SOB, wheezing, chest pain, nausea, changes in bowel or bladder habits.    ROS: As per HPI.  All other pertinent ROS negative.     Past Medical History:  Diagnosis Date  . Borderline hypertension   . Gestational diabetes 2016   Past Surgical History:  Procedure Laterality Date  . CESAREAN SECTION  2005  . CESAREAN SECTION N/A 10/10/2015   Procedure: CESAREAN SECTION;  Surgeon: [name], MD;  Location: [facility] ORS;  Service: Obstetrics;  Laterality: N/A;  . DIAGNOSTIC LAPAROSCOPY  2015  . DILATION AND CURETTAGE OF UTERUS    . TUBAL LIGATION  2016   No Known Allergies No current facility-administered medications on file prior to encounter.   Current Outpatient Medications on File Prior to Encounter  Medication Sig Dispense Refill  . DULoxetine (CYMBALTA) 20 MG capsule Take 1 capsule (20 mg total) by mouth daily. 90 capsule 3  . [DISCONTINUED] traZODone (DESYREL) 50 MG tablet Take 1 tablet (50 mg total) by mouth at bedtime as needed for sleep. 30 tablet 11   Social History   Socioeconomic History  . Marital status: Married    Spouse name: Not on file  . Number of children: Not on file  . Years of education: Not on file  . Highest education level: Not on file  Occupational History  . Not on file  Tobacco Use  . Smoking status: Former Smoker    Packs/day: 0.50    Years: 15.00    Pack years: 7.50    Types: Cigarettes    Quit date: 10/21/2011    Years since quitting:  8.7  . Smokeless tobacco: Never Used  Vaping Use  . Vaping Use: Never used  Substance and Sexual Activity  . Alcohol use: No    Alcohol/week: 0.0 standard drinks  . Drug use: No  . Sexual activity: Yes  Other Topics Concern  . Not on file  Social History Narrative  . Not on file   Social Determinants of Health   Financial Resource Strain:   . Difficulty of Paying Living Expenses: Not on file  Food Insecurity:   . Worried About Programme researcher, broadcasting/film/video in the Last Year: Not on file  . Ran Out of Food in the Last Year: Not on file  Transportation Needs:   . Lack of Transportation (Medical): Not on file  . Lack of Transportation (Non-Medical): Not on file  Physical Activity:   . Days of Exercise per Week: Not on file  . Minutes of Exercise per Session: Not on file  Stress:   . Feeling of Stress : Not on file  Social Connections:   . Frequency of Communication with Friends and Family: Not on file  . Frequency of Social Gatherings with Friends and Family: Not on file  . Attends Religious Services: Not on file  . Active Member of Clubs or Organizations: Not on file  . Attends Banker Meetings: Not on file  . Marital Status: Not on file  Intimate Partner Violence:   . Fear of Current or Ex-Partner: Not on file  . Emotionally Abused: Not on file  . Physically Abused: Not on file  . Sexually Abused: Not on file   Family History  Problem Relation Age of Onset  . Hypertension Mother   . Hypertension Father   . Stroke Maternal Grandfather   . Breast cancer Paternal Aunt 60    OBJECTIVE:  Vitals:   08/03/20 0826  BP: 132/88  Pulse: 87  Resp: 20  Temp: 98.5 F (36.9 C)  SpO2: 97%     General appearance: alert; appears fatigued, but nontoxic; speaking in full sentences and tolerating own secretions HEENT: NCAT; Ears: EACs clear, TMs pearly gray; Eyes: PERRL.  EOM grossly intact. Sinuses: TTP; Nose: nares patent without rhinorrhea, Throat: oropharynx clear,  tonsils non erythematous or enlarged, uvula midline  Neck: supple without LAD Lungs: unlabored respirations, symmetrical air entry; cough: mild; no respiratory distress; CTAB Heart: regular rate and rhythm.  Skin: warm and dry Psychological: alert and cooperative; normal mood and affect  LABS:  No results found for this or any previous visit (from the past 24 hour(s)).   ASSESSMENT & PLAN:  1. Encounter for screening for COVID-19   2. Viral URI   3. Suspected COVID-19 virus infection   4. Acute non-recurrent sinusitis, unspecified location     Meds ordered this encounter  Medications  . benzonatate (TESSALON) 100 MG capsule    Sig: Take 1 capsule (100 mg total) by mouth every 8 (eight) hours.    Dispense:  21 capsule    Refill:  0    Order Specific Question:   Supervising Provider    Answer:   Eustace Moore [[id number]]  . amoxicillin-clavulanate (AUGMENTIN) 875-125 MG tablet    Sig: Take 1 tablet by mouth every 12 (twelve) hours for 10 days.    Dispense:  20 tablet    Refill:  0    Order Specific Question:   Supervising Provider    Answer:   Eustace Moore [[id number]]   COVID testing ordered.  It will take between 5-7 days for test results.  Someone will contact you regarding abnormal results.    In the meantime: You should remain isolated in your home for 10 days from symptom onset AND greater than 72 hours after symptoms resolution (absence of fever without the use of fever-reducing medication and improvement in respiratory symptoms), whichever is longer Get plenty of rest and push fluids Use OTC zyrtec for nasal congestion, runny nose, and/or sore throat Use OTC flonase for nasal congestion and runny nose Use medications daily for symptom relief Use OTC medications like ibuprofen or tylenol as needed fever or pain Call or go to the ED if you have any new or worsening symptoms such as fever, worsening cough, shortness of breath, chest tightness, chest pain, turning  blue, changes in mental status, etc...   Augmentin for sinus infection  Reviewed expectations re: course of current medical issues. Questions answered. Outlined signs and symptoms indicating need for more acute intervention. Patient verbalized understanding. After Visit Summary given.         Alvino Chapel [place], PA-C 08/03/20 [phone]

## 2020-08-04 LAB — SARS-COV-2, NAA 2 DAY TAT

## 2020-08-04 LAB — NOVEL CORONAVIRUS, NAA: SARS-CoV-2, NAA: NOT DETECTED

## 2020-12-31 ENCOUNTER — Other Ambulatory Visit: Payer: Self-pay

## 2020-12-31 MED ORDER — DULOXETINE HCL 20 MG PO CPEP: 20.0000 mg | ORAL_CAPSULE | Freq: Every day | ORAL | 0 refills | Status: DC

## 2021-01-02 ENCOUNTER — Telehealth: Payer: Self-pay

## 2021-01-02 NOTE — Telephone Encounter (Signed)
-----   Message from [name], MD sent at 12/31/2020 11:14 AM EDT ----- Regarding: Annual Needs annual sometime in the next month

## 2021-01-02 NOTE — Telephone Encounter (Signed)
Called and left voicemail for patient to call back to be scheduled. 

## 2021-02-13 ENCOUNTER — Encounter: Payer: Self-pay | Admitting: Obstetrics and Gynecology

## 2021-02-13 ENCOUNTER — Other Ambulatory Visit: Payer: Self-pay

## 2021-02-13 ENCOUNTER — Other Ambulatory Visit (HOSPITAL_COMMUNITY)
Admission: RE | Admit: 2021-02-13 | Discharge: 2021-02-13 | Disposition: A | Discharge status: Home / Self Care | Payer: BC Managed Care – PPO | Source: Ambulatory Visit | Attending: Obstetrics and Gynecology | Admitting: Obstetrics and Gynecology

## 2021-02-13 ENCOUNTER — Ambulatory Visit (INDEPENDENT_AMBULATORY_CARE_PROVIDER_SITE_OTHER): Payer: BC Managed Care – PPO | Admitting: Obstetrics and Gynecology

## 2021-02-13 VITALS — BP 128/82 | HR 91 | Ht 66.0 in | Wt 279.0 lb

## 2021-02-13 DIAGNOSIS — Z1239 Encounter for other screening for malignant neoplasm of breast: Secondary | ICD-10-CM | POA: Diagnosis not present

## 2021-02-13 DIAGNOSIS — F325 Major depressive disorder, single episode, in full remission: Secondary | ICD-10-CM

## 2021-02-13 DIAGNOSIS — Z1231 Encounter for screening mammogram for malignant neoplasm of breast: Secondary | ICD-10-CM

## 2021-02-13 DIAGNOSIS — Z01419 Encounter for gynecological examination (general) (routine) without abnormal findings: Secondary | ICD-10-CM | POA: Diagnosis not present

## 2021-02-13 DIAGNOSIS — Z124 Encounter for screening for malignant neoplasm of cervix: Secondary | ICD-10-CM

## 2021-02-13 MED ORDER — DULOXETINE HCL 20 MG PO CPEP: 20.0000 mg | ORAL_CAPSULE | Freq: Every day | ORAL | 11 refills | Status: DC

## 2021-02-13 NOTE — Patient Instructions (Signed)
[facility] [address] [place] [address]  [facility] [place]  [address] [place] [address]  Phone: [phone]  

## 2021-02-13 NOTE — Progress Notes (Signed)
Gynecology Annual Exam  PCP: Lindwood Coke, MD  Chief Complaint:  Chief Complaint  Patient presents with  . Gynecologic Exam    Annual - no concerns. RM 4    History of Present Illness: Patient is a 45 y.o. G3P2010 presents for annual exam. The patient has no complaints today.   LMP: Patient's last menstrual period was 01/14/2021. No menstrual concerns. Regular monthly menses.  Has noted some menstrual migraines the last few months as main moliminal symptom which is a change for her.  The patient is sexually active. She currently uses tubal ligation for contraception. She denies dyspareunia.  The patient does perform self breast exams.  There is no notable family history of breast or ovarian cancer in her family.  The patient wears seatbelts: yes.   The patient has regular exercise: not asked.    The patient denies current symptoms of depression.    Review of Systems: Review of Systems  Constitutional: Negative for chills and fever.  HENT: Negative for congestion.   Respiratory: Negative for cough and shortness of breath.   Cardiovascular: Negative for chest pain and palpitations.  Gastrointestinal: Negative for abdominal pain, constipation, diarrhea, heartburn, nausea and vomiting.  Genitourinary: Negative for dysuria, frequency and urgency.  Skin: Negative for itching and rash.  Neurological: Positive for headaches. Negative for dizziness.  Endo/Heme/Allergies: Negative for polydipsia.  Psychiatric/Behavioral: Negative for depression.    Past Medical History:  Patient Active Problem List   Diagnosis Date Noted  . History of gestational diabetes 05/25/2018  . Morbid obesity (HCC) 05/25/2018  . History of cesarean delivery 04/26/2015    Pt planning ERLTCS For prior 8 lb infant in 2005    . History of PCOS 04/26/2015    Pt notes sub-fertility - took a long time to conceive   . Endometriosis determined by laparoscopy 04/26/2015    2015 l/s - pt ntoes she  conceived shortly after surgery      Past Surgical History:  Past Surgical History:  Procedure Laterality Date  . CESAREAN SECTION  2005  . CESAREAN SECTION N/A 10/10/2015   Procedure: CESAREAN SECTION;  Surgeon: [name], MD;  Location: [facility] ORS;  Service: Obstetrics;  Laterality: N/A;  . DIAGNOSTIC LAPAROSCOPY  2015  . DILATION AND CURETTAGE OF UTERUS    . TUBAL LIGATION  2016    Gynecologic History:  Patient's last menstrual period was 01/14/2021. Contraception: tubal ligation Last mammogram: 06/29/2020 Results were: BI-RAD I  Obstetric History: G3P2010  Family History:  Family History  Problem Relation Age of Onset  . Hypertension Mother   . Hypertension Father   . Stroke Maternal Grandfather   . Breast cancer Paternal Aunt 6    Social History:  Social History   Socioeconomic History  . Marital status: Married    Spouse name: Not on file  . Number of children: Not on file  . Years of education: Not on file  . Highest education level: Not on file  Occupational History  . Not on file  Tobacco Use  . Smoking status: Former Smoker    Packs/day: 0.50    Years: 15.00    Pack years: 7.50    Types: Cigarettes    Quit date: 10/21/2011    Years since quitting: 9.3  . Smokeless tobacco: Never Used  Vaping Use  . Vaping Use: Never used  Substance and Sexual Activity  . Alcohol use: No    Alcohol/week: 0.0 standard drinks  . Drug use: No  .  Sexual activity: Yes    Birth control/protection: Surgical    Comment: tubal  Other Topics Concern  . Not on file  Social History Narrative  . Not on file   Social Determinants of Health   Financial Resource Strain: Not on file  Food Insecurity: Not on file  Transportation Needs: Not on file  Physical Activity: Not on file  Stress: Not on file  Social Connections: Not on file  Intimate Partner Violence: Not on file    Allergies:  Allergies  Allergen Reactions  . Venlafaxine Itching     Medications: Prior to Admission medications   Medication Sig Start Date End Date Taking? Authorizing Provider  DULoxetine (CYMBALTA) 20 MG capsule Take 1 capsule (20 mg total) by mouth daily. 12/31/20 12/31/21 Yes [name], MD  benzonatate (TESSALON) 100 MG capsule Take 1 capsule (100 mg total) by mouth every 8 (eight) hours. Patient not taking: Reported on 02/13/2021 08/03/20   Wurst, [place], PA-C  traZODone (DESYREL) 50 MG tablet Take 1 tablet (50 mg total) by mouth at bedtime as needed for sleep. 10/25/18 08/03/20  [name], MD    Physical Exam Vitals: Blood pressure 128/82, pulse 91, height 5\' 6"  (1.676 m), weight 279 lb (126.6 kg), last menstrual period 01/14/2021.  General: NAD HEENT: normocephalic, anicteric Thyroid: no enlargement, no palpable nodules Pulmonary: No increased work of breathing, CTAB Cardiovascular: RRR, distal pulses 2+ Breast: Breast symmetrical, no tenderness, no palpable nodules or masses, no skin or nipple retraction present, no nipple discharge.  No axillary or supraclavicular lymphadenopathy. Abdomen: NABS, soft, non-tender, non-distended.  Umbilicus without lesions.  No hepatomegaly, splenomegaly or masses palpable. No evidence of hernia  Genitourinary:  External: Normal external female genitalia.  Normal urethral meatus, normal Bartholin's and Skene's glands.    Vagina: Normal vaginal mucosa, no evidence of prolapse.    Cervix: Grossly normal in appearance, no bleeding  Uterus: Non-enlarged, mobile, normal contour.  No CMT  Adnexa: ovaries non-enlarged, no adnexal masses  Rectal: deferred  Lymphatic: no evidence of inguinal lymphadenopathy Extremities: no edema, erythema, or tenderness Neurologic: Grossly intact Psychiatric: mood appropriate, affect full  Female chaperone present for pelvic and breast  portions of the physical exam  GAD 7 : Generalized Anxiety Score 02/13/2021 11/15/2019 10/17/2019 09/14/2019  Nervous, Anxious, on  Edge 1 2 1 1   Control/stop worrying 0 1 0 0  Worry too much - different things 0 1 0 0  Trouble relaxing 1 1 1 1   Restless 0 0 1 1  Easily annoyed or irritable 1 2 1 3   Afraid - awful might happen 0 0 0 0  Total GAD 7 Score 3 7 4 6   Anxiety Difficulty Not difficult at all Not difficult at all Not difficult at all -    Flowsheet Row Office Visit from 02/13/2021 in [facility]  PHQ-9 Total Score 4       Assessment: 45 y.o. G3P2010 routine annual exam  Plan: Problem List Items Addressed This Visit   None   Visit Diagnoses    Encounter for gynecological examination without abnormal finding    -  Primary   Breast screening       Screening for malignant neoplasm of cervix       Relevant Orders   Cytology - PAP   Breast cancer screening by mammogram       Relevant Orders   MM 3D SCREEN BREAST BILATERAL   Major depressive disorder with single episode, in full remission (HCC)  Relevant Medications   DULoxetine (CYMBALTA) 20 MG capsule      1) Mammogram - recommend yearly screening mammogram.  Mammogram Was ordered today   2) STI screening  was notoffered and therefore not obtained  3) ASCCP guidelines and rational discussed.  Patient opts for every 3 years screening interval  4) Contraception - the patient is currently using  tubal ligation.  She is not currently in need of contraception secondary to being sterile  5) Colonoscopy -- Screening recommended starting at age 41 for average risk individuals, age 75 for individuals deemed at increased risk (including African Americans) and recommended to continue until age 57.  For patient age 25-85 individualized approach is recommended.  Gold standard screening is via colonoscopy, Cologuard screening is an acceptable alternative for patient unwilling or unable to undergo colonoscopy.  "Colorectal cancer screening for average?risk adults: 2018 guideline update from the American Cancer Society"CA: A Cancer Journal for  Clinicians: Mar 18, 2017   6) Routine healthcare maintenance including cholesterol, diabetes screening discussed managed by PCP  7) Major depression - stable on current dose of cymbalta 20mg , continue  8) No follow-ups on file.   , MD, [name] OB/GYN, [facility] 02/13/2021, 9:41 AM

## 2021-02-15 LAB — CYTOLOGY - PAP
Adequacy: ABSENT
Comment: NEGATIVE
Diagnosis: NEGATIVE
High risk HPV: NEGATIVE

## 2021-03-26 ENCOUNTER — Other Ambulatory Visit: Payer: Self-pay | Admitting: Obstetrics and Gynecology

## 2021-03-26 DIAGNOSIS — R0683 Snoring: Secondary | ICD-10-CM

## 2021-06-17 ENCOUNTER — Ambulatory Visit (INDEPENDENT_AMBULATORY_CARE_PROVIDER_SITE_OTHER): Payer: BC Managed Care – PPO | Admitting: Obstetrics and Gynecology

## 2021-06-17 ENCOUNTER — Other Ambulatory Visit: Payer: Self-pay

## 2021-06-17 ENCOUNTER — Encounter: Payer: Self-pay | Admitting: Obstetrics and Gynecology

## 2021-06-17 VITALS — BP 126/78 | Ht 65.0 in | Wt 276.0 lb

## 2021-06-17 DIAGNOSIS — Z1329 Encounter for screening for other suspected endocrine disorder: Secondary | ICD-10-CM | POA: Diagnosis not present

## 2021-06-17 DIAGNOSIS — R5383 Other fatigue: Secondary | ICD-10-CM | POA: Diagnosis not present

## 2021-06-17 DIAGNOSIS — Z6841 Body Mass Index (BMI) 40.0 and over, adult: Secondary | ICD-10-CM

## 2021-06-17 DIAGNOSIS — Z13220 Encounter for screening for lipoid disorders: Secondary | ICD-10-CM

## 2021-06-17 DIAGNOSIS — Z131 Encounter for screening for diabetes mellitus: Secondary | ICD-10-CM | POA: Diagnosis not present

## 2021-06-17 MED ORDER — PHENTERMINE HCL 37.5 MG PO CAPS: 37.5000 mg | ORAL_CAPSULE | ORAL | 0 refills | Status: DC

## 2021-06-17 NOTE — Progress Notes (Signed)
Gynecology Office Visit  Chief Complaint:  Chief Complaint  Patient presents with   Weight Loss    RM 4    History of Present Illness: Patientis a 45 y.o. G59P2010 female, who presents for the evaluation of weight gain. She has lost 9 pounds primarily over year but has not been able to loose the amount of weight she would like The patient states the following issues have contributed to her weight problem: lifestyle, PCOS.  The patient has no additional symptoms. The patient specifically denies memory loss, muscle weakness, excessive thirst, and polyuria. Weight related co-morbidities include n. The patient's past medical history is notable for  history of gestational diabetes, endometriosis, PCOS.  She has tried not interventions in the past  Review of Systems: 10 point review of systems negative unless otherwise noted in HPI  Past Medical History:  Patient Active Problem List   Diagnosis Date Noted   History of gestational diabetes 05/25/2018   Morbid obesity (HCC) 05/25/2018   History of cesarean delivery 04/26/2015    Pt planning ERLTCS For prior 8 lb infant in 2005     History of PCOS 04/26/2015    Pt notes sub-fertility - took a long time to conceive    Endometriosis determined by laparoscopy 04/26/2015    2015 l/s - pt ntoes she conceived shortly after surgery      Past Surgical History:  Past Surgical History:  Procedure Laterality Date   CESAREAN SECTION  2005   CESAREAN SECTION N/A 10/10/2015   Procedure: CESAREAN SECTION;  Surgeon: [name], MD;  Location: [facility] ORS;  Service: Obstetrics;  Laterality: N/A;   DIAGNOSTIC LAPAROSCOPY  2015   DILATION AND CURETTAGE OF UTERUS     TUBAL LIGATION  2016    Gynecologic History: Patient's last menstrual period was 06/15/2021.  Obstetric History: G3P2010  Family History:  Family History  Problem Relation Age of Onset   Hypertension Mother    Hypertension Father    Stroke Maternal Grandfather    Breast  cancer Paternal Aunt 31    Social History:  Social History   Socioeconomic History   Marital status: Married    Spouse name: Not on file   Number of children: Not on file   Years of education: Not on file   Highest education level: Not on file  Occupational History   Not on file  Tobacco Use   Smoking status: Former    Packs/day: 0.50    Years: 15.00    Pack years: 7.50    Types: Cigarettes    Quit date: 10/21/2011    Years since quitting: 9.6   Smokeless tobacco: Never  Vaping Use   Vaping Use: Never used  Substance and Sexual Activity   Alcohol use: No    Alcohol/week: 0.0 standard drinks   Drug use: No   Sexual activity: Yes    Birth control/protection: Surgical    Comment: tubal  Other Topics Concern   Not on file  Social History Narrative   Not on file   Social Determinants of Health   Financial Resource Strain: Not on file  Food Insecurity: Not on file  Transportation Needs: Not on file  Physical Activity: Not on file  Stress: Not on file  Social Connections: Not on file  Intimate Partner Violence: Not on file    Allergies:  Allergies  Allergen Reactions   Venlafaxine Itching    Medications: Prior to Admission medications   Medication Sig Start Date End Date  Taking? Authorizing Provider  DULoxetine (CYMBALTA) 20 MG capsule Take 1 capsule (20 mg total) by mouth daily. 02/13/21 02/13/22 Yes [name], MD  traZODone (DESYREL) 50 MG tablet Take 1 tablet (50 mg total) by mouth at bedtime as needed for sleep. 10/25/18 08/03/20  [name], MD    Physical Exam Blood pressure 126/78, height 5\' 5"  (1.651 m), weight 276 lb (125.2 kg), last menstrual period 06/15/2021. Patient's last menstrual period was 06/15/2021. Body mass index is 45.93 kg/m.   General: NAD HEENT: normocephalic, anicteric Thyroid: no enlargement Pulmonary: no increased work of breathing Neurologic: Grossly intact Psychiatric: mood appropriate, affect full  Assessment:  45 y.o. G3P2010 presenting for discussion of weight loss management options  Plan: Problem List Items Addressed This Visit   None   1) 1500 Calorie ADA Diet  2) Patient education given regarding appropriate lifestyle changes for weight loss including: regular physical activity, healthy coping strategies, caloric restriction and healthy eating patterns.  3) Patient will be started on weight loss medication. The risks and benefits and side effects of medication, such as Adipex (Phenteramine) ,  Tenuate (Diethylproprion), Contrave (buproprion/naltrexone), Qsymia (phentermine/topiramate), and Saxenda (liraglutide) is discussed. The pros and cons of suppressing appetite and boosting metabolism is discussed. Risks of tolerence and addiction is discussed for selected agents discussed. Use of medicine will ne short term, such as 3-4 months at a time followed by a period of time off of the medicine to avoid these risks and side effects for Adipex, Qsymia, and Tenuate discussed. Pt to call with any negative side effects and agrees to keep follow up appts.  4) Comorbidity Screening - hypothyroidism screening, diabetes, and hyperlipidemia screening offered  5) Encouraged weekly weight monitorig to track progress and sample 1 week food diary  6) Contraception - discussed that all weight loss drugs fall in to pregnancy category X, patient currently has reliable contraception in the form of tubal ligation  7) 15 minutes face-to-face; counseling/coordination of care > 50 percent of visit  8) Return in about 4 weeks (around 07/15/2021) for medication follow up (in person or phone).   07/17/2021, MD, [name] OB/GYN, [facility] 06/17/2021, 2:57 PM

## 2021-06-18 LAB — LIPID PANEL
Chol/HDL Ratio: 3.6 ratio (ref 0.0–4.4)
Cholesterol, Total: 160 mg/dL (ref 100–199)
HDL: 45 mg/dL (ref >39)
LDL Chol Calc (NIH): 70 mg/dL (ref 0–99)
Triglycerides: 284 mg/dL — ABNORMAL HIGH (ref 0–149)
VLDL Cholesterol Cal: 45 mg/dL — ABNORMAL HIGH (ref 5–40)

## 2021-06-18 LAB — TSH: TSH: 2.31 u[IU]/mL (ref 0.450–4.500)

## 2021-06-18 LAB — HEMOGLOBIN A1C
Est. average glucose Bld gHb Est-mCnc: 123 mg/dL
Hgb A1c MFr Bld: 5.9 % — ABNORMAL HIGH (ref 4.8–5.6)

## 2021-07-15 ENCOUNTER — Other Ambulatory Visit: Payer: Self-pay

## 2021-07-15 ENCOUNTER — Ambulatory Visit (INDEPENDENT_AMBULATORY_CARE_PROVIDER_SITE_OTHER): Payer: BC Managed Care – PPO | Admitting: Obstetrics and Gynecology

## 2021-07-15 ENCOUNTER — Encounter: Payer: Self-pay | Admitting: Obstetrics and Gynecology

## 2021-07-15 VITALS — BP 138/92 | Ht 65.0 in | Wt 268.0 lb

## 2021-07-15 DIAGNOSIS — Z6841 Body Mass Index (BMI) 40.0 and over, adult: Secondary | ICD-10-CM

## 2021-07-15 MED ORDER — PHENTERMINE HCL 37.5 MG PO CAPS
37.5000 mg | ORAL_CAPSULE | ORAL | 0 refills | Status: DC
Start: 2021-07-15 — End: 2021-08-12

## 2021-07-15 NOTE — Progress Notes (Signed)
Gynecology Office Visit  Chief Complaint:  Chief Complaint  Patient presents with   Follow-up    Weight loss - no concerns. RM 4    History of Present Illness: Patientis a 45 y.o. G10P2010 female, who presents for the evaluation of the desire to lose weight. She has lost 8 pounds 1 months. The patient states the following symptoms since starting her weight loss therapy: appetite suppression, energy, and weight loss.  The patient also reports no other ill effects. The patient specifically denies heart palpitations, anxiety, and insomnia.    Review of Systems: 10 point review of systems negative unless otherwise noted in HPI  Past Medical History:  Past Medical History:  Diagnosis Date   Borderline hypertension    Gestational diabetes 2016    Past Surgical History:  Past Surgical History:  Procedure Laterality Date   CESAREAN SECTION  2005   CESAREAN SECTION N/A 10/10/2015   Procedure: CESAREAN SECTION;  Surgeon: [name], MD;  Location: [facility] ORS;  Service: Obstetrics;  Laterality: N/A;   DIAGNOSTIC LAPAROSCOPY  2015   DILATION AND CURETTAGE OF UTERUS     TUBAL LIGATION  2016    Gynecologic History: Patient's last menstrual period was 06/15/2021.  Obstetric History: G3P2010  Family History:  Family History  Problem Relation Age of Onset   Hypertension Mother    Hypertension Father    Stroke Maternal Grandfather    Breast cancer Paternal Aunt 68    Social History:  Social History   Socioeconomic History   Marital status: Married    Spouse name: Not on file   Number of children: Not on file   Years of education: Not on file   Highest education level: Not on file  Occupational History   Not on file  Tobacco Use   Smoking status: Former    Packs/day: 0.50    Years: 15.00    Pack years: 7.50    Types: Cigarettes    Quit date: 10/21/2011    Years since quitting: 9.7   Smokeless tobacco: Never  Vaping Use   Vaping Use: Never used  Substance and  Sexual Activity   Alcohol use: No    Alcohol/week: 0.0 standard drinks   Drug use: No   Sexual activity: Yes    Birth control/protection: Surgical    Comment: tubal  Other Topics Concern   Not on file  Social History Narrative   Not on file   Social Determinants of Health   Financial Resource Strain: Not on file  Food Insecurity: Not on file  Transportation Needs: Not on file  Physical Activity: Not on file  Stress: Not on file  Social Connections: Not on file  Intimate Partner Violence: Not on file    Allergies:  Allergies  Allergen Reactions   Venlafaxine Itching    Medications: Prior to Admission medications   Medication Sig Start Date End Date Taking? Authorizing Provider  DULoxetine (CYMBALTA) 20 MG capsule Take 1 capsule (20 mg total) by mouth daily. 02/13/21 02/13/22 Yes [name], MD  phentermine 37.5 MG capsule Take 1 capsule (37.5 mg total) by mouth every morning. 06/17/21  Yes [name], MD  traZODone (DESYREL) 50 MG tablet Take 1 tablet (50 mg total) by mouth at bedtime as needed for sleep. 10/25/18 08/03/20  [name], MD    Physical Exam Blood pressure (!) 138/92, height 5\' 5"  (1.651 m), weight 268 lb (121.6 kg), last menstrual period 06/15/2021. Wt Readings from Last 3 Encounters:  07/15/21 268 lb (121.6 kg)  06/17/21 276 lb (125.2 kg)  02/13/21 279 lb (126.6 kg)  Body mass index is 44.6 kg/m.   General: NAD HEENT: normocephalic, anicteric Thyroid: no enlargement Pulmonary: no increased work of breathing Neurologic: Grossly intact Psychiatric: mood appropriate, affect full  Assessment: 45 y.o. G3P2010 medication follow up  Plan: Problem List Items Addressed This Visit   None Visit Diagnoses     Class 3 severe obesity without serious comorbidity with body mass index (BMI) of 40.0 to 44.9 in adult, unspecified obesity type (HCC)    -  Primary       1) 1500 Calorie ADA Diet  2) Patient education given regarding  appropriate lifestyle changes for weight loss including: regular physical activity, healthy coping strategies, caloric restriction and healthy eating patterns.  3) Patient will be started on weight loss medication. The risks and benefits and side effects of medication, such as Adipex (Phenteramine) ,  Tenuate (Diethylproprion), Belviq (lorcarsin), Contrave (buproprion/naltrexone), Qsymia (phentermine/topiramate), and Saxenda (liraglutide) is discussed. The pros and cons of suppressing appetite and boosting metabolism is discussed. Risks of tolerence and addiction is discussed for selected agents discussed. Use of medicine will ne short term, such as 3-4 months at a time followed by a period of time off of the medicine to avoid these risks and side effects for Adipex, Qsymia, and Tenuate discussed. Pt to call with any negative side effects and agrees to keep follow up appts.  4) Patient to take medication, with the benefits of appetite suppression and metabolism boost d/w pt, along with the side effects and risk factors of long term use that will be avoided with our use of short bursts of therapy. Rx provided.    5) 15 minutes face-to-face; with counseling/coordination of care > 50 percent of visit related to obesity and ongoing management/treatment   6) Return in about 4 weeks (around 08/12/2021) for medication follow up (phone or in person).    [name], MD, [facility], [facility] 07/15/2021, 3:13 PM

## 2021-07-22 ENCOUNTER — Other Ambulatory Visit: Payer: Self-pay | Admitting: Obstetrics and Gynecology

## 2021-07-22 MED ORDER — NITROFURANTOIN MONOHYD MACRO 100 MG PO CAPS: 100.0000 mg | ORAL_CAPSULE | Freq: Two times a day (BID) | ORAL | 0 refills | Status: AC

## 2021-08-02 ENCOUNTER — Ambulatory Visit
Admission: RE | Admit: 2021-08-02 | Discharge: 2021-08-02 | Disposition: A | Discharge status: Home / Self Care | Payer: BC Managed Care – PPO | Source: Ambulatory Visit | Attending: Obstetrics and Gynecology | Admitting: Obstetrics and Gynecology

## 2021-08-02 ENCOUNTER — Other Ambulatory Visit: Payer: Self-pay

## 2021-08-02 DIAGNOSIS — Z1231 Encounter for screening mammogram for malignant neoplasm of breast: Secondary | ICD-10-CM | POA: Insufficient documentation

## 2021-08-12 ENCOUNTER — Other Ambulatory Visit: Payer: Self-pay

## 2021-08-12 ENCOUNTER — Ambulatory Visit (INDEPENDENT_AMBULATORY_CARE_PROVIDER_SITE_OTHER): Payer: BC Managed Care – PPO | Admitting: Obstetrics and Gynecology

## 2021-08-12 ENCOUNTER — Encounter: Payer: Self-pay | Admitting: Obstetrics and Gynecology

## 2021-08-12 VITALS — Ht 65.0 in | Wt 261.0 lb

## 2021-08-12 DIAGNOSIS — F411 Generalized anxiety disorder: Secondary | ICD-10-CM | POA: Diagnosis not present

## 2021-08-12 MED ORDER — DULOXETINE HCL 20 MG PO CPEP
40.0000 mg | ORAL_CAPSULE | Freq: Every day | ORAL | 11 refills | Status: AC
Start: 2021-08-12 — End: 2022-08-12

## 2021-08-12 MED ORDER — PHENTERMINE HCL 37.5 MG PO CAPS
37.5000 mg | ORAL_CAPSULE | ORAL | 0 refills | Status: DC
Start: 2021-08-12 — End: 2021-09-11

## 2021-08-12 NOTE — Progress Notes (Signed)
I connected with Lindsey Bell on 08/12/21 at  8:50 AM EDT by telephone and verified that I am speaking with the correct person using two identifiers.   I discussed the limitations, risks, security and privacy concerns of performing an evaluation and management service by telephone and the availability of in person appointments. I also discussed with the patient that there may be a patient responsible charge related to this service. The patient expressed understanding and agreed to proceed.  The patient was at home I spoke with the patient from my workstation phone The names of people involved in this encounter were: Lindsey Bell , and Lindsey Bell   Gynecology Office Visit  Chief Complaint:  Chief Complaint  Patient presents with   Follow-up    Phone visit - no concerns.     History of Present Illness: Patientis a 45 y.o. G54P2010 female, who presents for the evaluation of the desire to lose weight. She has lost 7 pounds 1 months. The patient states the following symptoms since starting her weight loss therapy: appetite suppression, energy, and weight loss.  The patient also reports no other ill effects. The patient specifically denies heart palpitations, anxiety, and insomnia.   She has had some additional recent stressors.  Youngest daughter diagnosed with ADHD and so she has been back and forth with the school on that.  Feels like her current dose of Cymbalta isn't covering her anxiety as well anymore  Review of Systems: 10 point review of systems negative unless otherwise noted in HPI  Past Medical History:  Past Medical History:  Diagnosis Date   Borderline hypertension    Gestational diabetes 2016    Past Surgical History:  Past Surgical History:  Procedure Laterality Date   CESAREAN SECTION  2005   CESAREAN SECTION N/A 10/10/2015   Procedure: CESAREAN SECTION;  Surgeon: Lindsey Austria, MD;  Location: [facility] ORS;  Service: Obstetrics;  Laterality: N/A;    DIAGNOSTIC LAPAROSCOPY  2015   DILATION AND CURETTAGE OF UTERUS     TUBAL LIGATION  2016    Gynecologic History: Patient's last menstrual period was 07/20/2021.  Obstetric History: G3P2010  Family History:  Family History  Problem Relation Age of Onset   Hypertension Mother    Hypertension Father    Stroke Maternal Grandfather    Breast cancer Paternal Aunt 23    Social History:  Social History   Socioeconomic History   Marital status: Married    Spouse name: Not on file   Number of children: Not on file   Years of education: Not on file   Highest education level: Not on file  Occupational History   Not on file  Tobacco Use   Smoking status: Former    Packs/day: 0.50    Years: 15.00    Pack years: 7.50    Types: Cigarettes    Quit date: 10/21/2011    Years since quitting: 9.8   Smokeless tobacco: Never  Vaping Use   Vaping Use: Never used  Substance and Sexual Activity   Alcohol use: No    Alcohol/week: 0.0 standard drinks   Drug use: No   Sexual activity: Yes    Birth control/protection: Surgical    Comment: tubal  Other Topics Concern   Not on file  Social History Narrative   Not on file   Social Determinants of Health   Financial Resource Strain: Not on file  Food Insecurity: Not on file  Transportation Needs: Not on file  Physical Activity: Not on file  Stress: Not on file  Social Connections: Not on file  Intimate Partner Violence: Not on file    Allergies:  Allergies  Allergen Reactions   Venlafaxine Itching    Medications: Prior to Admission medications   Medication Sig Start Date End Date Taking? Authorizing Provider  DULoxetine (CYMBALTA) 20 MG capsule Take 1 capsule (20 mg total) by mouth daily. 02/13/21 02/13/22 Yes Lindsey Austria, MD  phentermine 37.5 MG capsule Take 1 capsule (37.5 mg total) by mouth every morning. 07/15/21  Yes Lindsey Austria, MD  traZODone (DESYREL) 50 MG tablet Take 1 tablet (50 mg total) by mouth at bedtime  as needed for sleep. 10/25/18 08/03/20  Lindsey Austria, MD    Physical Exam Height 5\' 5"  (1.651 m), weight 261 lb (118.4 kg), last menstrual period 07/20/2021. Wt Readings from Last 3 Encounters:  08/12/21 261 lb (118.4 kg)  07/15/21 268 lb (121.6 kg)  06/17/21 276 lb (125.2 kg)  Body mass index is 43.43 kg/m.  No physical exam as this was a remote telephone visit to promote social distancing during the current COVID-19 Pandemic    Assessment: 45 y.o. G3P2010 weight loss management follow up, generalized anxiety   Plan: Problem List Items Addressed This Visit   None Visit Diagnoses     Generalized anxiety disorder    -  Primary   Relevant Medications   DULoxetine (CYMBALTA) 20 MG capsule       1) 1500 Calorie ADA Diet  2) Patient education given regarding appropriate lifestyle changes for weight loss including: regular physical activity, healthy coping strategies, caloric restriction and healthy eating patterns.  3) Patient to take medication, with the benefits of appetite suppression and metabolism boost d/w pt, along with the side effects and risk factors of long term use that will be avoided with our use of short bursts of therapy. Rx provided.   4) Increase cymbalta dose to 40mg  from 20mg    5) Telephone Time 7:73min  6) Return in about 4 weeks (around 09/09/2021) for medication follow up.    , MD, 44m OB/GYN, [facility] 08/12/2021, 8:36 AM

## 2021-09-11 ENCOUNTER — Ambulatory Visit: Payer: BC Managed Care – PPO | Admitting: Obstetrics and Gynecology

## 2021-09-11 ENCOUNTER — Ambulatory Visit (INDEPENDENT_AMBULATORY_CARE_PROVIDER_SITE_OTHER): Payer: BC Managed Care – PPO | Admitting: Obstetrics and Gynecology

## 2021-09-11 ENCOUNTER — Encounter: Payer: Self-pay | Admitting: Obstetrics and Gynecology

## 2021-09-11 ENCOUNTER — Other Ambulatory Visit: Payer: Self-pay

## 2021-09-11 VITALS — Ht 66.0 in | Wt 257.0 lb

## 2021-09-11 DIAGNOSIS — Z6841 Body Mass Index (BMI) 40.0 and over, adult: Secondary | ICD-10-CM | POA: Diagnosis not present

## 2021-09-11 MED ORDER — PHENTERMINE HCL 37.5 MG PO CAPS
37.5000 mg | ORAL_CAPSULE | ORAL | 0 refills | Status: DC
Start: 2021-09-11 — End: 2021-12-02

## 2021-09-11 NOTE — Progress Notes (Signed)
I connected with Lindsey Bell  09/11/21 at  8:10 AM EST by telephone and verified that I am speaking with the correct person using two identifiers.   I discussed the limitations, risks, security and privacy concerns of performing an evaluation and management service by telephone and the availability of in person appointments. I also discussed with the patient that there may be a patient responsible charge related to this service. The patient expressed understanding and agreed to proceed.  The patient was at home I spoke with the patient from my workstation phone The names of people involved in this encounter were: Rosanna Randy , and [name]  Gynecology Office Visit  Chief Complaint:  Chief Complaint  Patient presents with   Follow-up    Phone visit - weight loss, no concerns.    History of Present Illness: Patientis a 45 y.o. G47P2010 female, who presents for the evaluation of the desire to lose weight. She has lost 5 pounds 1 months. The patient states the following symptoms since starting her weight loss therapy: appetite suppression, energy, and weight loss.  The patient also reports no other ill effects. The patient specifically denies heart palpitations, anxiety, and insomnia.    Review of Systems: 10 point review of systems negative unless otherwise noted in HPI  Past Medical History:  Past Medical History:  Diagnosis Date   Borderline hypertension    Gestational diabetes 2016    Past Surgical History:  Past Surgical History:  Procedure Laterality Date   CESAREAN SECTION  2005   CESAREAN SECTION N/A 10/10/2015   Procedure: CESAREAN SECTION;  Surgeon: [name], MD;  Location: [facility] ORS;  Service: Obstetrics;  Laterality: N/A;   DIAGNOSTIC LAPAROSCOPY  2015   DILATION AND CURETTAGE OF UTERUS     TUBAL LIGATION  2016    Gynecologic History: Patient's last menstrual period was 08/28/2021.  Obstetric History: G3P2010  Family History:  Family  History  Problem Relation Age of Onset   Hypertension Mother    Hypertension Father    Stroke Maternal Grandfather    Breast cancer Paternal Aunt 80    Social History:  Social History   Socioeconomic History   Marital status: Married    Spouse name: Not on file   Number of children: Not on file   Years of education: Not on file   Highest education level: Not on file  Occupational History   Not on file  Tobacco Use   Smoking status: Former    Packs/day: 0.50    Years: 15.00    Pack years: 7.50    Types: Cigarettes    Quit date: 10/21/2011    Years since quitting: 9.8   Smokeless tobacco: Never  Vaping Use   Vaping Use: Never used  Substance and Sexual Activity   Alcohol use: No    Alcohol/week: 0.0 standard drinks   Drug use: No   Sexual activity: Yes    Birth control/protection: Surgical    Comment: tubal  Other Topics Concern   Not on file  Social History Narrative   Not on file   Social Determinants of Health   Financial Resource Strain: Not on file  Food Insecurity: Not on file  Transportation Needs: Not on file  Physical Activity: Not on file  Stress: Not on file  Social Connections: Not on file  Intimate Partner Violence: Not on file    Allergies:  Allergies  Allergen Reactions   Venlafaxine Itching    Medications:  Prior to Admission medications   Medication Sig Start Date End Date Taking? Authorizing Provider  DULoxetine (CYMBALTA) 20 MG capsule Take 2 capsules (40 mg total) by mouth daily. 08/12/21 08/12/22 Yes [name], MD  phentermine 37.5 MG capsule Take 1 capsule (37.5 mg total) by mouth every morning. 08/12/21  Yes [name], MD  traZODone (DESYREL) 50 MG tablet Take 1 tablet (50 mg total) by mouth at bedtime as needed for sleep. 10/25/18 08/03/20  [name], MD    Physical Exam Height 5\' 6"  (1.676 m), weight 257 lb (116.6 kg), last menstrual period 08/28/2021. Wt Readings from Last 3 Encounters:  09/11/21 257 lb  (116.6 kg)  08/12/21 261 lb (118.4 kg)  07/15/21 268 lb (121.6 kg)  Body mass index is 41.48 kg/m.  No physical exam as this was a remote telephone visit to promote social distancing during the current COVID-19 Pandemic  Assessment: 45 y.o. G3P2010 weight loss follow up   Plan: Problem List Items Addressed This Visit   None Visit Diagnoses     Class 3 severe obesity without serious comorbidity with body mass index (BMI) of 40.0 to 44.9 in adult, unspecified obesity type (HCC)    -  Primary   Relevant Medications   phentermine 37.5 MG capsule       1) 1500 Calorie ADA Diet  2) Patient education given regarding appropriate lifestyle changes for weight loss including: regular physical activity, healthy coping strategies, caloric restriction and healthy eating patterns.  3) Patient will be started on weight loss medication. The risks and benefits and side effects of medication, such as Adipex (Phenteramine) ,  Tenuate (Diethylproprion), Belviq (lorcarsin), Contrave (buproprion/naltrexone), Qsymia (phentermine/topiramate), and Saxenda (liraglutide) is discussed. The pros and cons of suppressing appetite and boosting metabolism is discussed. Risks of tolerence and addiction is discussed for selected agents discussed. Use of medicine will ne short term, such as 3-4 months at a time followed by a period of time off of the medicine to avoid these risks and side effects for Adipex, Qsymia, and Tenuate discussed. Pt to call with any negative side effects and agrees to keep follow up appts.  4) Patient to take medication, with the benefits of appetite suppression and metabolism boost d/w pt, along with the side effects and risk factors of long term use that will be avoided with our use of short bursts of therapy. Rx provided.    5) Telephone   6)    60, MD, [name] OB/GYN, [facility] 09/11/2021, 8:20 AM

## 2021-09-13 IMAGING — CT CT PELVIS W/ CM
2 of 3 series · 15 of 46 positions shown, 17 images · IV contrast (omnipaque)
Comparison: Ultrasound 07/04/2019

CLINICAL DATA: Adnexal mass on ultrasound

EXAM:
CT PELVIS WITH CONTRAST
TECHNIQUE: Multidetector CT imaging of the pelvis was performed using the
standard protocol following the bolus administration of intravenous
contrast.
CONTRAST:  100mL OMNIPAQUE IOHEXOL 300 MG/ML  SOLN

[Series 3: axial st · axial · 0.88mm/px · z∈[-996,-778]mm · 12 of 127 slices shown, 14 images]
[im 9/127  soft-tissue]
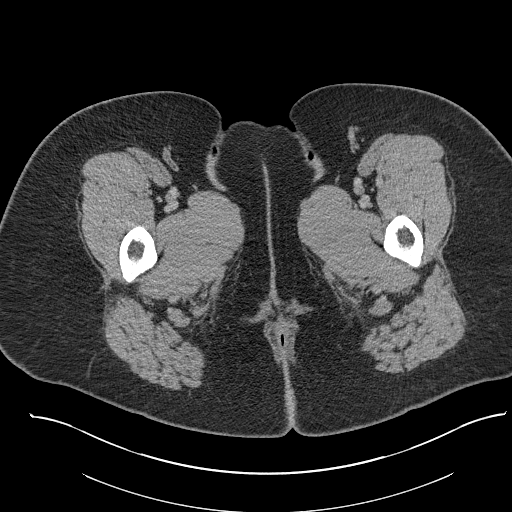
[im 9/127  bone]
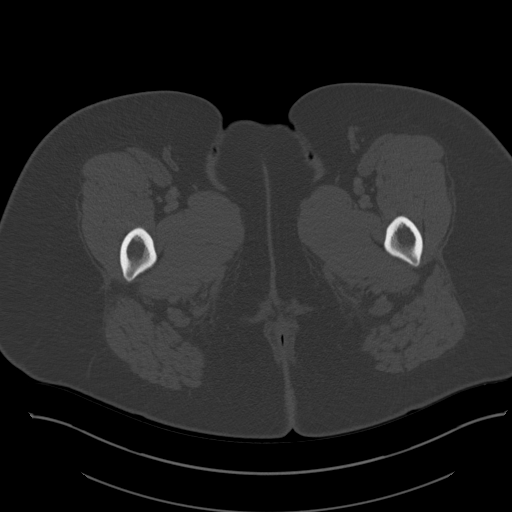
[im 17/127  soft-tissue]
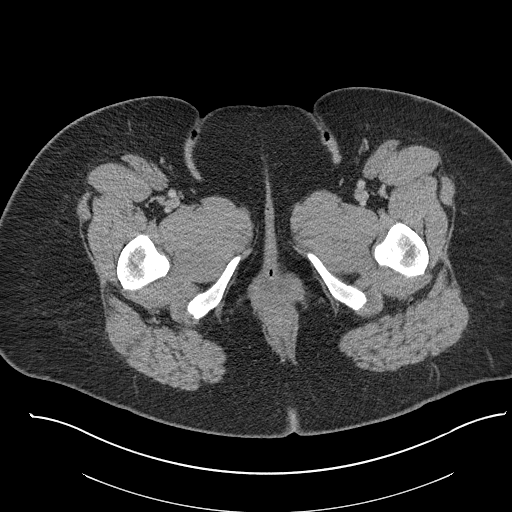
[im 29/127  soft-tissue]
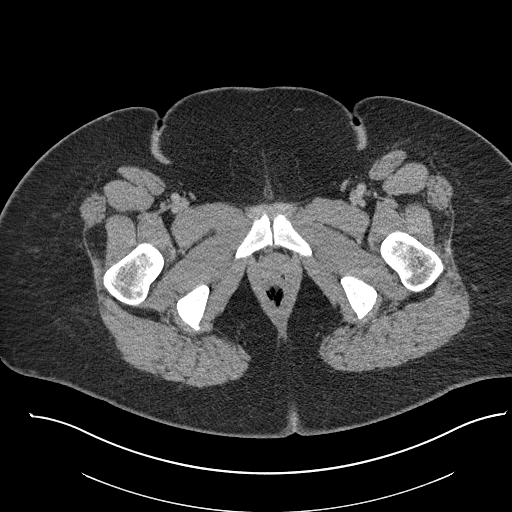
[im 37/127  soft-tissue]
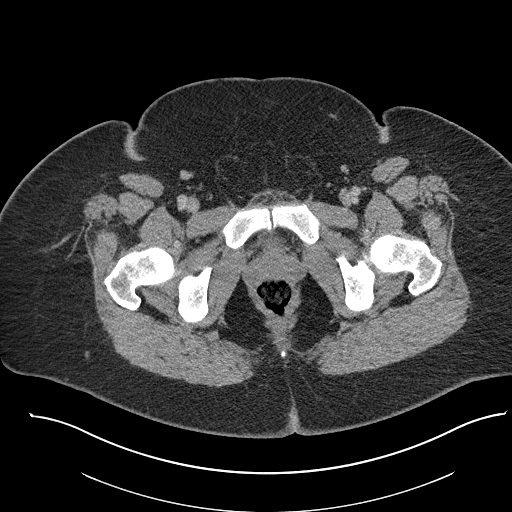
[im 49/127  soft-tissue]
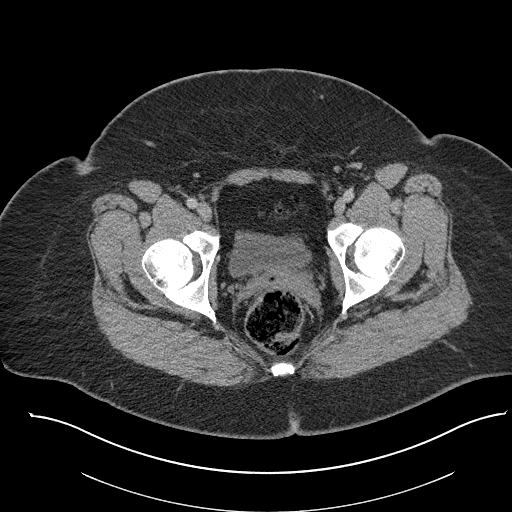
[im 57/127  soft-tissue]
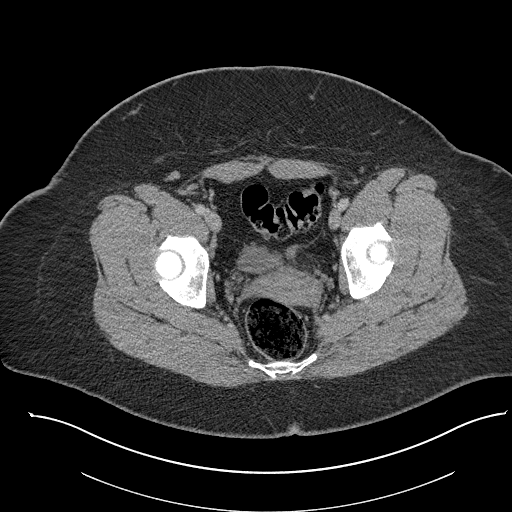
[im 70/127  soft-tissue]
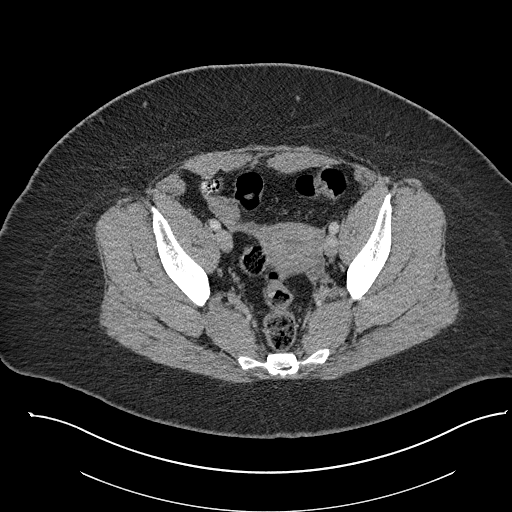
[im 78/127  soft-tissue]
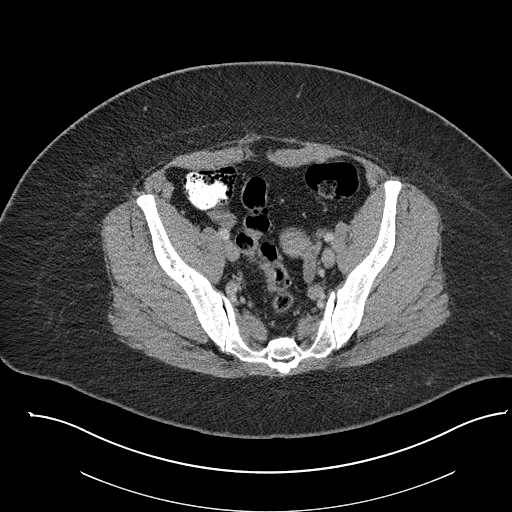
[im 90/127  soft-tissue]
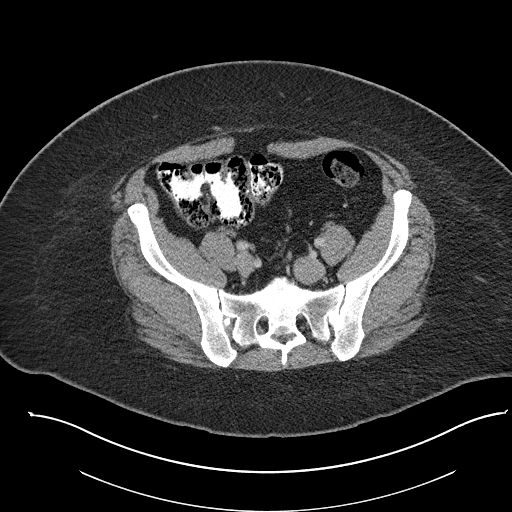
[im 90/127  bone]
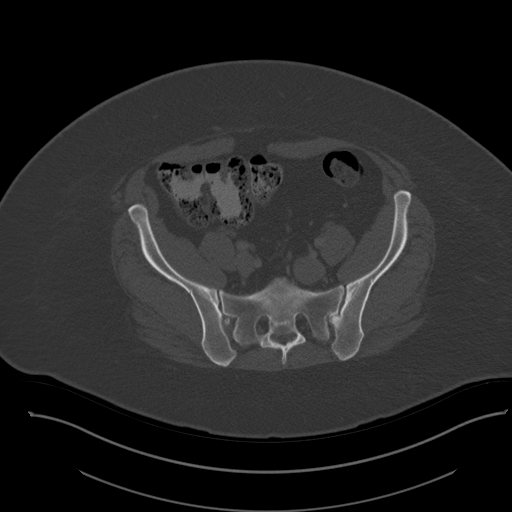
[im 98/127  soft-tissue]
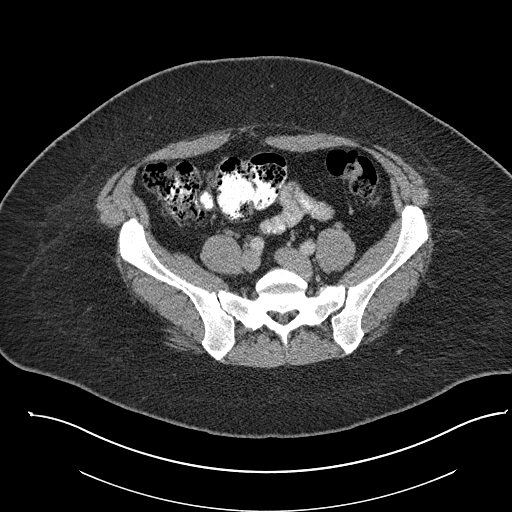
[im 110/127  soft-tissue]
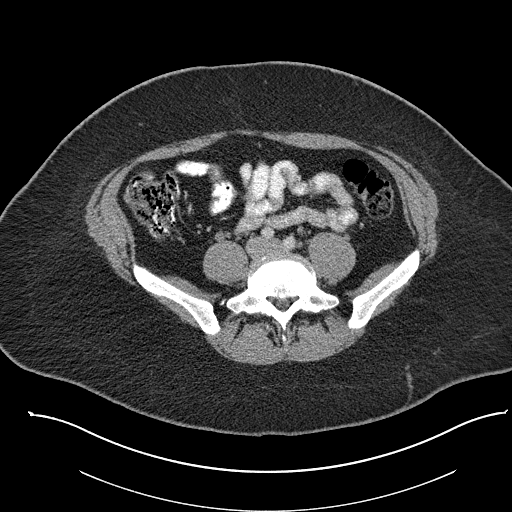
[im 118/127  soft-tissue]
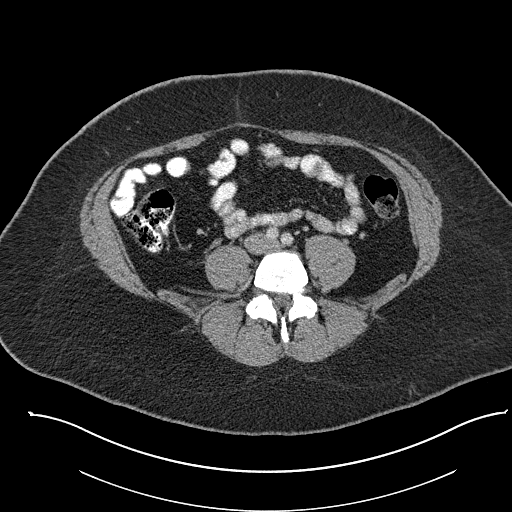

[Series 7: coronal st · coronal · 0.51mm/px · 3 of 173 slices shown]
[im 58/173  soft-tissue]
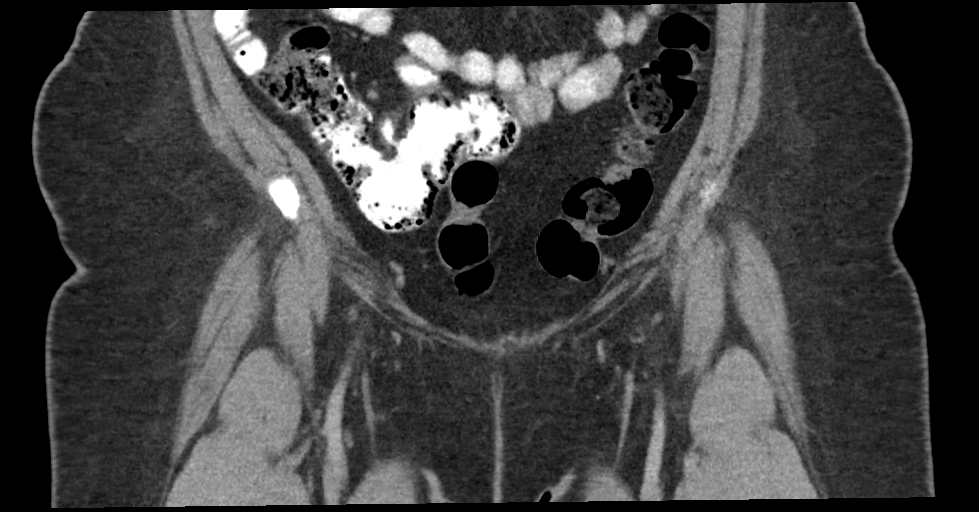
[im 77/173  soft-tissue]
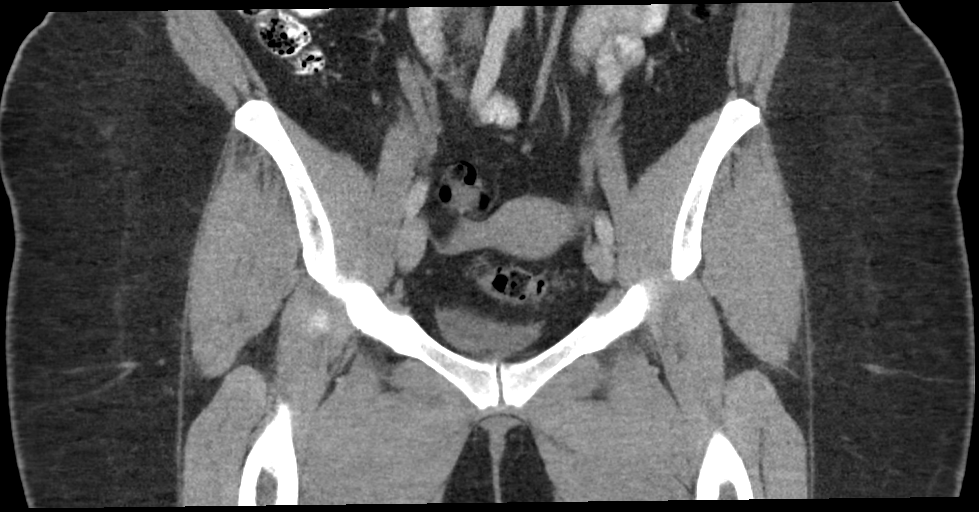
[im 96/173  soft-tissue]
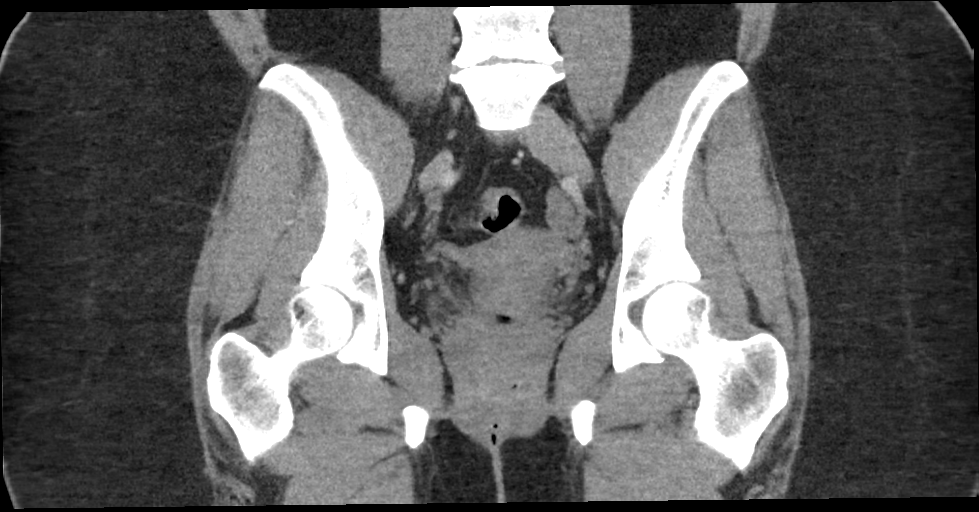

[15 of 46 positions shown; findings below may reference images not displayed]

FINDINGS: Urinary Tract:  Visualized ureters and urinary bladder decompressed.

Bowel:  Visualized large and small bowel unremarkable.

Vascular/Lymphatic: No visible aneurysm or adenopathy.

Reproductive: Uterus and adnexa unremarkable. No mass. No visible
adnexal mass. Probable small follicle within the right ovary.

Other:  No free fluid or free air.

Musculoskeletal: No acute bony abnormality.
IMPRESSION: No acute findings or significant abnormality. No visible adnexal
mass.

## 2021-10-09 ENCOUNTER — Ambulatory Visit (INDEPENDENT_AMBULATORY_CARE_PROVIDER_SITE_OTHER): Payer: BC Managed Care – PPO | Admitting: Obstetrics and Gynecology

## 2021-10-09 ENCOUNTER — Encounter: Payer: Self-pay | Admitting: Obstetrics and Gynecology

## 2021-10-09 ENCOUNTER — Other Ambulatory Visit: Payer: Self-pay

## 2021-10-09 ENCOUNTER — Telehealth: Payer: Self-pay

## 2021-10-09 VITALS — Ht 65.0 in | Wt 252.0 lb

## 2021-10-09 DIAGNOSIS — Z91199 Patient's noncompliance with other medical treatment and regimen due to unspecified reason: Secondary | ICD-10-CM

## 2021-10-09 NOTE — Progress Notes (Signed)
Unable to reach patient.

## 2021-10-09 NOTE — Telephone Encounter (Signed)
LMOM fp pt to re call

## 2021-10-15 ENCOUNTER — Other Ambulatory Visit: Payer: Self-pay | Admitting: Obstetrics and Gynecology

## 2021-12-02 ENCOUNTER — Ambulatory Visit (INDEPENDENT_AMBULATORY_CARE_PROVIDER_SITE_OTHER): Payer: BC Managed Care – PPO | Admitting: Obstetrics & Gynecology

## 2021-12-02 ENCOUNTER — Encounter: Payer: Self-pay | Admitting: Obstetrics & Gynecology

## 2021-12-02 ENCOUNTER — Other Ambulatory Visit: Payer: Self-pay

## 2021-12-02 DIAGNOSIS — Z6841 Body Mass Index (BMI) 40.0 and over, adult: Secondary | ICD-10-CM

## 2021-12-02 MED ORDER — WEGOVY 0.5 MG/0.5ML ~~LOC~~ SOAJ: 0.5000 mg | SUBCUTANEOUS | 6 refills | Status: DC

## 2021-12-02 NOTE — Progress Notes (Signed)
°  History of Present Illness:  Lindsey Bell is a 46 y.o. who was started on Phentermine approximately 5 months ago due to obesity/abnormal weight gain, but then stopped in Dec.  She has h/o borderline elevated blood pressures.  Also has h/o gest DM (testing last year normal).   The patient reports no significant weight change. (over the last 2 mos, off of meds.  She did lose weight on Phentermine. She has these side effects: dry mouth.  PMHx: She  has a past medical history of Borderline hypertension and Gestational diabetes (2016). Also,  has a past surgical history that includes Cesarean section (2005); Diagnostic laparoscopy (2015); Dilation and curettage of uterus; Cesarean section (N/A, 10/10/2015); and Tubal ligation (2016)., family history includes Breast cancer (age of onset: 89) in her paternal aunt; Hypertension in her father and mother; Stroke in her maternal grandfather.,  reports that she quit smoking about 10 years ago. Her smoking use included cigarettes. She has a 7.50 pack-year smoking history. She has never used smokeless tobacco. She reports that she does not drink alcohol and does not use drugs.  She has a current medication list which includes the following prescription(s): duloxetine, wegovy, and [DISCONTINUED] trazodone. Also, is allergic to venlafaxine.  Review of Systems  All other systems reviewed and are negative.  Physical Exam:  BP (!) 142/90    Ht 5\' 5"  (1.651 m)    Wt 259 lb (117.5 kg)    LMP 11/26/2021    BMI 43.10 kg/m  Body mass index is 43.1 kg/m. Filed Weights   12/02/21 0905  Weight: 259 lb (117.5 kg)    Physical Exam Constitutional:      General: She is not in acute distress.    Appearance: She is well-developed.  Musculoskeletal:        General: Normal range of motion.  Neurological:     Mental Status: She is alert and oriented to person, place, and time.  Skin:    General: Skin is warm and dry.  Vitals reviewed.    Assessment:  obesity  Plan: Patient will be started on Wegovy (Ozempic) at this time due to continued need for assistance w weight loss, high blood pressure today (precluding restart of Phentermine), and risks for diabetes (weight, prior gestational DM).  Also encouraged to obtain and see regularly a PCP.  List provided of options in the area.   Will continue to assist patient in incorporating positive experiences into her life to promote a positive mental attitude.  Education given regarding appropriate lifestyle changes for weight loss, including regular physical activity, healthy coping strategies, caloric restriction, and healthy eating patterns.  The risks and benefits as well as side effects of medication, such as Phenteramine or Tenuate, is discussed.  Also Wegovy and Ozempic (different mechanism of action).The pros and cons of suppressing appetite and boosting metabolism is counseled.  Risks of tolerance and addiction discussed.  Use of medicine will be short term.  Pt to call with any negative side effects and agrees to keep follow up appointments.  A total of 22 minutes were spent face-to-face with the patient as well as preparation, review, communication, and documentation during this encounter.   11-28-1989, MD, Annamarie Major Ob/Gyn, [facility] 12/02/2021  9:30 AM

## 2021-12-02 NOTE — Patient Instructions (Signed)
Semaglutide Injection (Weight Management) What is this medication? SEMAGLUTIDE (SEM a GLOO tide) promotes weight loss. It may also be used to maintain weight loss. It works by decreasing appetite. Changes to diet and exercise are often combined with this medication. This medicine may be used for other purposes; ask your health care provider or pharmacist if you have questions. COMMON BRAND NAME(S): [id number], Ozempic What should I tell my care team before I take this medication? They need to know if you have any of these conditions: Endocrine tumors (MEN 2) or if someone in your family had these tumors Eye disease, vision problems Gallbladder disease History of depression or mental health disease History of pancreatitis Kidney disease Stomach or intestine problems Suicidal thoughts, plans, or attempt; a previous suicide attempt by you or a family member Thyroid cancer or if someone in your family had thyroid cancer An unusual or allergic reaction to semaglutide, other medications, foods, dyes, or preservatives Pregnant or trying to get pregnant Breast-feeding How should I use this medication? This medication is injected under the skin. You will be taught how to prepare and give it. Take it as directed on the prescription label. It is given once every week (every 7 days). Keep taking it unless your care team tells you to stop. It is important that you put your used needles and pens in a special sharps container. Do not put them in a trash can. If you do not have a sharps container, call your pharmacist or care team to get one. A special MedGuide will be given to you by the pharmacist with each prescription and refill. Be sure to read this information carefully each time. This medication comes with INSTRUCTIONS FOR USE. Ask your pharmacist for directions on how to use this medication. Read the information carefully. Talk to your pharmacist or care team if you have questions. Talk to your care team  about the use of this medication in children. Special care may be needed. Overdosage: If you think you have taken too much of this medicine contact a poison control center or emergency room at once. NOTE: This medicine is only for you. Do not share this medicine with others. What if I miss a dose? If you miss a dose and the next scheduled dose is more than 2 days away, take the missed dose as soon as possible. If you miss a dose and the next scheduled dose is less than 2 days away, do not take the missed dose. Take the next dose at your regular time. Do not take double or extra doses. If you miss your dose for 2 weeks or more, take the next dose at your regular time or call your care team to talk about how to restart this medication. What may interact with this medication? Insulin and other medications for diabetes This list may not describe all possible interactions. Give your health care provider a list of all the medicines, herbs, non-prescription drugs, or dietary supplements you use. Also tell them if you smoke, drink alcohol, or use illegal drugs. Some items may interact with your medicine. What should I watch for while using this medication? Visit your care team for regular checks on your progress. It may be some time before you see the benefit from this medication. Drink plenty of fluids while taking this medication. Check with your care team if you have severe diarrhea, nausea, and vomiting, or if you sweat a lot. The loss of too much body fluid may make it dangerous  for you to take this medication. This medication may affect blood sugar levels. Ask your care team if changes in diet or medications are needed if you have diabetes. If you or your family notice any changes in your behavior, such as new or worsening depression, thoughts of harming yourself, anxiety, other unusual or disturbing thoughts, or memory loss, call your care team right away. Women should inform their care team if they wish  to become pregnant or think they might be pregnant. Losing weight while pregnant is not advised and may cause harm to the unborn child. Talk to your care team for more information. What side effects may I notice from receiving this medication? Side effects that you should report to your care team as soon as possible: Allergic reactions--skin rash, itching, hives, swelling of the face, lips, tongue, or throat Change in vision Dehydration--increased thirst, dry mouth, feeling faint or lightheaded, headache, dark yellow or brown urine Gallbladder problems--severe stomach pain, nausea, vomiting, fever Heart palpitations--rapid, pounding, or irregular heartbeat Kidney injury--decrease in the amount of urine, swelling of the ankles, hands, or feet Pancreatitis--severe stomach pain that spreads to your back or gets worse after eating or when touched, fever, nausea, vomiting Thoughts of suicide or self-harm, worsening mood, feelings of depression Thyroid cancer--new mass or lump in the neck, pain or trouble swallowing, trouble breathing, hoarseness Side effects that usually do not require medical attention (report to your care team if they continue or are bothersome): Diarrhea Loss of appetite Nausea Stomach pain Vomiting This list may not describe all possible side effects. Call your doctor for medical advice about side effects. You may report side effects to FDA at 1-800-FDA-1088. Where should I keep my medication? Keep out of the reach of children and pets. Refrigeration (preferred): Store in the refrigerator. Do not freeze. Keep this medication in the original container until you are ready to take it. Get rid of any unused medication after the expiration date. Room temperature: If needed, prior to cap removal, the pen can be stored at room temperature for up to 28 days. Protect from light. If it is stored at room temperature, get rid of any unused medication after 28 days or after it expires,  whichever is first. It is important to get rid of the medication as soon as you no longer need it or it is expired. You can do this in two ways: Take the medication to a medication take-back program. Check with your pharmacy or law enforcement to find a location. If you cannot return the medication, follow the directions in the MedGuide. NOTE: This sheet is a summary. It may not cover all possible information. If you have questions about this medicine, talk to your doctor, pharmacist, or health care provider.   Primary Care in the area  This is not meant to be comprehensive list, but a resource for some providers that you can call for counseling or medication needs. If you have a recommendation, please let us know.   [facility]:  [phone]               Dr Julieanne Manson               Dr Jamse Belfast               Dr Shirlee Latch  [facility]:  [phone]  Dr Danna Hefty [facility], [place]:   [phone]  Hannah Beat, MD  Gweneth Dimitri,  MD  Eustaquio Boyden, MD  Excell Seltzer, MD  [facility], [place]: [phone]  Quentin Ore, MD

## 2021-12-03 ENCOUNTER — Ambulatory Visit (INDEPENDENT_AMBULATORY_CARE_PROVIDER_SITE_OTHER): Payer: BC Managed Care – PPO | Admitting: Obstetrics & Gynecology

## 2021-12-03 ENCOUNTER — Encounter: Payer: Self-pay | Admitting: Obstetrics & Gynecology

## 2021-12-03 ENCOUNTER — Ambulatory Visit: Payer: Self-pay

## 2021-12-03 VITALS — BP 140/98

## 2021-12-03 DIAGNOSIS — Z6841 Body Mass Index (BMI) 40.0 and over, adult: Secondary | ICD-10-CM | POA: Diagnosis not present

## 2021-12-03 MED ORDER — WEGOVY 0.5 MG/0.5ML ~~LOC~~ SOAJ: 0.5000 mg | SUBCUTANEOUS | 6 refills | Status: DC

## 2021-12-03 MED ORDER — OZEMPIC (0.25 OR 0.5 MG/DOSE) 2 MG/1.5ML ~~LOC~~ SOPN: 0.5000 mg | PEN_INJECTOR | SUBCUTANEOUS | 11 refills | Status: DC

## 2021-12-03 NOTE — Progress Notes (Signed)
Obstetrics & Gynecology Office Visit   Chief Complaint: No chief complaint on file.   History of Present Illness: 46 y.o. G3P2010 being seen for follow up blood pressure check today.  The patient is not pregnant.   The established diagnosis for the patient is  none, but she has had elevated blood pressures in past, including yesterday .  She is currently on no antihypertensives.  She reports no current symptoms attributable to her blood pressure.  Medication list reviewed medications which may contribute to BP elevation were not noted.  Past Medical History:  Past Medical History:  Diagnosis Date   Borderline hypertension    Gestational diabetes 2016    Past Surgical History:  Past Surgical History:  Procedure Laterality Date   CESAREAN SECTION  2005   CESAREAN SECTION N/A 10/10/2015   Procedure: CESAREAN SECTION;  Surgeon: [name], MD;  Location: [facility] ORS;  Service: Obstetrics;  Laterality: N/A;   DIAGNOSTIC LAPAROSCOPY  2015   DILATION AND CURETTAGE OF UTERUS     TUBAL LIGATION  2016    Gynecologic History: Patient's last menstrual period was 11/26/2021.  Obstetric History: G3P2010  Family History:  Family History  Problem Relation Age of Onset   Hypertension Mother    Hypertension Father    Stroke Maternal Grandfather    Breast cancer Paternal Aunt 62    Social History:  Social History   Socioeconomic History   Marital status: Married    Spouse name: Not on file   Number of children: Not on file   Years of education: Not on file   Highest education level: Not on file  Occupational History   Not on file  Tobacco Use   Smoking status: Former    Packs/day: 0.50    Years: 15.00    Pack years: 7.50    Types: Cigarettes    Quit date: 10/21/2011    Years since quitting: 10.1   Smokeless tobacco: Never  Vaping Use   Vaping Use: Never used  Substance and Sexual Activity   Alcohol use: No    Alcohol/week: 0.0 standard drinks   Drug use: No    Sexual activity: Yes    Birth control/protection: Surgical    Comment: tubal  Other Topics Concern   Not on file  Social History Narrative   Not on file   Social Determinants of Health   Financial Resource Strain: Not on file  Food Insecurity: Not on file  Transportation Needs: Not on file  Physical Activity: Not on file  Stress: Not on file  Social Connections: Not on file  Intimate Partner Violence: Not on file    Allergies:  Allergies  Allergen Reactions   Venlafaxine Itching    Medications: Prior to Admission medications   Medication Sig Start Date End Date Taking? Authorizing Provider  Semaglutide,0.25 or 0.5MG /DOS, (OZEMPIC, 0.25 OR 0.5 MG/DOSE,) 2 MG/1.5ML SOPN Inject 0.5 mg into the skin once a week. 12/03/21  Yes Nadara Mustard, MD  DULoxetine (CYMBALTA) 20 MG capsule Take 2 capsules (40 mg total) by mouth daily. 08/12/21 08/12/22  [name], MD  Semaglutide-Weight Management (WEGOVY) 0.5 MG/0.5ML SOAJ Inject 0.5 mg into the skin once a week. 12/03/21   Nadara Mustard, MD  traZODone (DESYREL) 50 MG tablet Take 1 tablet (50 mg total) by mouth at bedtime as needed for sleep. 10/25/18 08/03/20  [name], MD    Review of Systems  All other systems reviewed and are negative.  Physical  Exam Blood pressure (!) 140/98, last menstrual period 11/26/2021.  Patient's last menstrual period was 11/26/2021.  Assessment: 46 y.o. G3P2010 presenting for blood pressure evaluation today  Plan: Problem List Items Addressed This Visit   None Visit Diagnoses     Class 3 severe obesity without serious comorbidity with body mass index (BMI) of 40.0 to 44.9 in adult, unspecified obesity type (HCC)    -  Primary   Relevant Medications   Semaglutide-Weight Management (WEGOVY) 0.5 MG/0.5ML SOAJ   Semaglutide,0.25 or 0.5MG /DOS, (OZEMPIC, 0.25 OR 0.5 MG/DOSE,) 2 MG/1.5ML SOPN       1) Blood pressure - blood pressure at today's visit is elevated.  As a result  she  willnot be prescribed Phentermine, and is encouraged to f/u with PCP and cont w BP checks in case therapy is indicated. Marland Kitchen 2) Will cont to try to get Ozempic or Wegovy off backorder as weight loss management strategy  Annamarie Major, MD, Merlinda Frederick Ob/Gyn, [facility] 12/03/2021  4:43 PM

## 2021-12-04 ENCOUNTER — Encounter: Payer: Self-pay | Admitting: Obstetrics & Gynecology

## 2021-12-05 ENCOUNTER — Other Ambulatory Visit: Payer: Self-pay | Admitting: Obstetrics & Gynecology

## 2021-12-12 ENCOUNTER — Ambulatory Visit
Admission: EM | Admit: 2021-12-12 | Discharge: 2021-12-12 | Disposition: A | Discharge status: Home / Self Care | Payer: BC Managed Care – PPO | Attending: Urgent Care | Admitting: Urgent Care

## 2021-12-12 ENCOUNTER — Encounter: Payer: Self-pay | Admitting: Emergency Medicine

## 2021-12-12 ENCOUNTER — Other Ambulatory Visit: Payer: Self-pay

## 2021-12-12 DIAGNOSIS — R070 Pain in throat: Secondary | ICD-10-CM | POA: Diagnosis not present

## 2021-12-12 DIAGNOSIS — J309 Allergic rhinitis, unspecified: Secondary | ICD-10-CM

## 2021-12-12 DIAGNOSIS — H9203 Otalgia, bilateral: Secondary | ICD-10-CM

## 2021-12-12 DIAGNOSIS — J3489 Other specified disorders of nose and nasal sinuses: Secondary | ICD-10-CM

## 2021-12-12 DIAGNOSIS — B349 Viral infection, unspecified: Secondary | ICD-10-CM | POA: Diagnosis not present

## 2021-12-12 LAB — POCT RAPID STREP A (OFFICE): Rapid Strep A Screen: NEGATIVE

## 2021-12-12 MED ORDER — LEVOCETIRIZINE DIHYDROCHLORIDE 5 MG PO TABS: 5.0000 mg | ORAL_TABLET | Freq: Every evening | ORAL | 0 refills | Status: AC

## 2021-12-12 MED ORDER — PREDNISONE 50 MG PO TABS: 50.0000 mg | ORAL_TABLET | Freq: Every day | ORAL | 0 refills | Status: DC

## 2021-12-12 NOTE — ED Provider Notes (Signed)
[facility]   MRN: [id number] DOB: 01-07-1976  Subjective:   Lindsey Bell is a 46 y.o. female presenting for 1 day history of acute onset sinus pressure, sinus congestion, bilateral ear pain, throat pain, drainage.  No coughing, chest pain, shortness of breath.  Patient has a history of bad allergies but does not take anything consistently for it.  She has used over-the-counter cough and cold medications without any relief.  She is not a smoker, no vaping, no asthma.  No current facility-administered medications for this encounter.  Current Outpatient Medications:    DULoxetine (CYMBALTA) 20 MG capsule, Take 2 capsules (40 mg total) by mouth daily., Disp: 30 capsule, Rfl: 11   Semaglutide,0.25 or 0.5MG /DOS, (OZEMPIC, 0.25 OR 0.5 MG/DOSE,) 2 MG/1.5ML SOPN, Inject 0.5 mg into the skin once a week., Disp: 1.5 mL, Rfl: 11   Semaglutide-Weight Management (WEGOVY) 0.5 MG/0.5ML SOAJ, Inject 0.5 mg into the skin once a week., Disp: 2 mL, Rfl: 6   Allergies  Allergen Reactions   Venlafaxine Itching    Past Medical History:  Diagnosis Date   Borderline hypertension    Gestational diabetes 2016     Past Surgical History:  Procedure Laterality Date   CESAREAN SECTION  2005   CESAREAN SECTION N/A 10/10/2015   Procedure: CESAREAN SECTION;  Surgeon: Malachy Mood, MD;  Location: [facility] ORS;  Service: Obstetrics;  Laterality: N/A;   DIAGNOSTIC LAPAROSCOPY  2015   DILATION AND CURETTAGE OF UTERUS     TUBAL LIGATION  2016    Family History  Problem Relation Age of Onset   Hypertension Mother    Hypertension Father    Stroke Maternal Grandfather    Breast cancer Paternal Aunt 51    Social History   Tobacco Use   Smoking status: Former    Packs/day: 0.50    Years: 15.00    Pack years: 7.50    Types: Cigarettes    Quit date: 10/21/2011    Years since quitting: 10.1   Smokeless tobacco: Never  Vaping Use   Vaping Use: Never used  Substance Use Topics   Alcohol  use: No    Alcohol/week: 0.0 standard drinks   Drug use: No    ROS   Objective:   Vitals: BP (!) 148/90 (BP Location: Right Arm)    Pulse 95    Temp 98.3 F (36.8 C) (Oral)    Resp 18    LMP 11/26/2021    SpO2 97%   Physical Exam Constitutional:      General: She is not in acute distress.    Appearance: Normal appearance. She is well-developed and normal weight. She is not ill-appearing, toxic-appearing or diaphoretic.  HENT:     Head: Normocephalic and atraumatic.     Right Ear: Tympanic membrane, ear canal and external ear normal. No drainage or tenderness. No middle ear effusion. There is no impacted cerumen. Tympanic membrane is not erythematous.     Left Ear: Tympanic membrane, ear canal and external ear normal. No drainage or tenderness.  No middle ear effusion. There is no impacted cerumen. Tympanic membrane is not erythematous.     Nose: Congestion and rhinorrhea present.     Comments: Nasal mucosa boggy and edematous.    Mouth/Throat:     Mouth: Mucous membranes are moist. No oral lesions.     Pharynx: No pharyngeal swelling, oropharyngeal exudate, posterior oropharyngeal erythema or uvula swelling.     Tonsils: No tonsillar exudate or tonsillar abscesses.  Comments: Significant postnasal drainage overlying pharynx. Eyes:     General: No scleral icterus.       Right eye: No discharge.        Left eye: No discharge.     Extraocular Movements: Extraocular movements intact.     Right eye: Normal extraocular motion.     Left eye: Normal extraocular motion.     Conjunctiva/sclera: Conjunctivae normal.  Cardiovascular:     Rate and Rhythm: Normal rate.  Pulmonary:     Effort: Pulmonary effort is normal.  Musculoskeletal:     Cervical back: Normal range of motion and neck supple.  Lymphadenopathy:     Cervical: No cervical adenopathy.  Skin:    General: Skin is warm and dry.  Neurological:     General: No focal deficit present.     Mental Status: She is alert and  oriented to person, place, and time.  Psychiatric:        Mood and Affect: Mood normal.        Behavior: Behavior normal.    Results for orders placed or performed during the hospital encounter of 12/12/21 (from the past 24 hour(s))  POCT rapid strep A     Status: None   Collection Time: 12/12/21  9:16 AM  Result Value Ref Range   Rapid Strep A Screen Negative Negative    Assessment and Plan :   PDMP not reviewed this encounter.  1. Acute viral syndrome   2. Stuffy and runny nose   3. Acute ear pain, bilateral   4. Throat pain   5. Allergic rhinitis, unspecified seasonality, unspecified trigger    Throat culture, COVID and flu testing is pending.  Patient requested aggressive management as she is supposed to have a large get together for her parents this weekend.  In the context of her allergic rhinitis, offered her an oral prednisone course but emphasized need to be on Xyzal long-term.  Use supportive care otherwise. Deferred imaging given clear cardiopulmonary exam, hemodynamically stable vital signs.  Counseled patient on potential for adverse effects with medications prescribed/recommended today, ER and return-to-clinic precautions discussed, patient verbalized understanding.    Jaynee Eagles, [place] 12/12/21 [phone]

## 2021-12-12 NOTE — ED Triage Notes (Signed)
Sore throat since yesterday, nasal congestion, feels fatigued, chill last night.

## 2021-12-13 LAB — COVID-19, FLU A+B NAA
Influenza A, NAA: NOT DETECTED
Influenza B, NAA: NOT DETECTED
SARS-CoV-2, NAA: NOT DETECTED

## 2021-12-17 ENCOUNTER — Ambulatory Visit: Payer: BC Managed Care – PPO | Admitting: Obstetrics & Gynecology

## 2021-12-18 ENCOUNTER — Ambulatory Visit
Admission: RE | Admit: 2021-12-18 | Discharge: 2021-12-18 | Disposition: A | Discharge status: Home / Self Care | Payer: BC Managed Care – PPO | Source: Ambulatory Visit | Attending: Family Medicine | Admitting: Family Medicine

## 2021-12-18 ENCOUNTER — Other Ambulatory Visit: Payer: Self-pay

## 2021-12-18 VITALS — BP 132/89 | HR 96 | Temp 98.3°F | Resp 18

## 2021-12-18 DIAGNOSIS — J0100 Acute maxillary sinusitis, unspecified: Secondary | ICD-10-CM | POA: Diagnosis not present

## 2021-12-18 MED ORDER — AMOXICILLIN-POT CLAVULANATE 875-125 MG PO TABS: 1.0000 | ORAL_TABLET | Freq: Two times a day (BID) | ORAL | 0 refills | Status: DC

## 2021-12-18 NOTE — ED Triage Notes (Signed)
Patient states she was here last week and has not gotten any better ? ?Patient states that both of ears are killing her and lots of pressure behind her eye and still coughing ? ?Pt states her ears feel like when you get water in them after swimming ? ?Pt states she took all of Prednisone, Mucinnex and antihistamine ? ?Denies Fever  ?

## 2021-12-18 NOTE — ED Provider Notes (Signed)
?[facility] ? ? ? ?CSN: [id number] ?Arrival date & time: 12/18/21  1651 ? ? ?  ? ?History   ?Chief Complaint ?Chief Complaint  ?Patient presents with  ? sinus issues  ? ? ?HPI ?Lindsey Bell is a 46 y.o. female.  ? ?Presenting today with progressive sinus pain and pressure, congestion, ear pressure, sinus headache for the past week or 2.  She was treated last week with prednisone, Mucinex, antihistamines with no benefit.  Denies fever, chills, chest pain, shortness of breath, abdominal pain, nausea vomiting or diarrhea.  New sick contacts recently. ? ? ?Past Medical History:  ?Diagnosis Date  ? Borderline hypertension   ? Gestational diabetes 2016  ? ? ?Patient Active Problem List  ? Diagnosis Date Noted  ? History of gestational diabetes 05/25/2018  ? Morbid obesity (HCC) 05/25/2018  ? History of cesarean delivery 04/26/2015  ? History of PCOS 04/26/2015  ? Endometriosis determined by laparoscopy 04/26/2015  ? ? ?Past Surgical History:  ?Procedure Laterality Date  ? CESAREAN SECTION  2005  ? CESAREAN SECTION N/A 10/10/2015  ? Procedure: CESAREAN SECTION;  Surgeon: [name], MD;  Location: [facility] ORS;  Service: Obstetrics;  Laterality: N/A;  ? DIAGNOSTIC LAPAROSCOPY  2015  ? DILATION AND CURETTAGE OF UTERUS    ? TUBAL LIGATION  2016  ? ? ?OB History   ? ? Gravida  ?3  ? Para  ?2  ? Term  ?2  ? Preterm  ?   ? AB  ?1  ? Living  ?0  ?  ? ? SAB  ?1  ? IAB  ?   ? Ectopic  ?   ? Multiple  ?0  ? Live Births  ?   ?   ?  ?  ? ? ? ?Home Medications   ? ?Prior to Admission medications   ?Medication Sig Start Date End Date Taking? Authorizing Provider  ?amoxicillin-clavulanate (AUGMENTIN) 875-125 MG tablet Take 1 tablet by mouth every 12 (twelve) hours. 12/18/21  Yes Particia Nearing, PA-C  ?DULoxetine (CYMBALTA) 20 MG capsule Take 2 capsules (40 mg total) by mouth daily. 08/12/21 08/12/22  [name], MD  ?levocetirizine (XYZAL) 5 MG tablet Take 1 tablet (5 mg total) by mouth every evening.  12/12/21   Wallis Bamberg, PA-C  ?predniSONE (DELTASONE) 50 MG tablet Take 1 tablet (50 mg total) by mouth daily with breakfast. 12/12/21   Wallis Bamberg, PA-C  ?Semaglutide,0.25 or 0.5MG /DOS, (OZEMPIC, 0.25 OR 0.5 MG/DOSE,) 2 MG/1.5ML SOPN Inject 0.5 mg into the skin once a week. 12/03/21   Nadara Mustard, MD  ?Semaglutide-Weight Management [facility]) 0.5 MG/0.5ML SOAJ Inject 0.5 mg into the skin once a week. 12/03/21   Nadara Mustard, MD  ?traZODone (DESYREL) 50 MG tablet Take 1 tablet (50 mg total) by mouth at bedtime as needed for sleep. 10/25/18 08/03/20  [name], MD  ? ? ?Family History ?Family History  ?Problem Relation Age of Onset  ? Hypertension Mother   ? Hypertension Father   ? Stroke Maternal Grandfather   ? Breast cancer Paternal Aunt 71  ? ? ?Social History ?Social History  ? ?Tobacco Use  ? Smoking status: Former  ?  Packs/day: 0.50  ?  Years: 15.00  ?  Pack years: 7.50  ?  Types: Cigarettes  ?  Quit date: 10/21/2011  ?  Years since quitting: 10.1  ? Smokeless tobacco: Never  ?Vaping Use  ? Vaping Use: Never used  ?Substance Use Topics  ?  Alcohol use: No  ?  Alcohol/week: 0.0 standard drinks  ? Drug use: No  ? ? ? ?Allergies   ?Venlafaxine ? ? ?Review of Systems ?Review of Systems ?Per HPI ? ?Physical Exam ?Triage Vital Signs ?ED Triage Vitals  ?Enc Vitals Group  ?   BP 12/18/21 1657 132/89  ?   Pulse Rate 12/18/21 1657 96  ?   Resp 12/18/21 1657 18  ?   Temp 12/18/21 1657 98.3 ?F (36.8 ?C)  ?   Temp Source 12/18/21 1657 Oral  ?   SpO2 12/18/21 1657 96 %  ?   Weight --   ?   Height --   ?   Head Circumference --   ?   Peak Flow --   ?   Pain Score 12/18/21 1659 6  ?   Pain Loc --   ?   Pain Edu? --   ?   Excl. in GC? --   ? ?No data found. ? ?Updated Vital Signs ?BP 132/89 (BP Location: Right Arm)   Pulse 96   Temp 98.3 ?F (36.8 ?C) (Oral)   Resp 18   LMP 11/26/2021   SpO2 96%  ? ?Visual Acuity ?Right Eye Distance:   ?Left Eye Distance:   ?Bilateral Distance:   ? ?Right Eye Near:   ?Left Eye  Near:    ?Bilateral Near:    ? ?Physical Exam ?Vitals and nursing note reviewed.  ?Constitutional:   ?   Appearance: Normal appearance. She is not ill-appearing.  ?HENT:  ?   Head: Atraumatic.  ?   Right Ear: Tympanic membrane normal.  ?   Left Ear: Tympanic membrane normal.  ?   Nose: Congestion present.  ?   Mouth/Throat:  ?   Mouth: Mucous membranes are moist.  ?   Pharynx: Posterior oropharyngeal erythema present. No oropharyngeal exudate.  ?Eyes:  ?   Extraocular Movements: Extraocular movements intact.  ?   Conjunctiva/sclera: Conjunctivae normal.  ?Cardiovascular:  ?   Rate and Rhythm: Normal rate and regular rhythm.  ?   Heart sounds: Normal heart sounds.  ?Pulmonary:  ?   Effort: Pulmonary effort is normal.  ?   Breath sounds: Normal breath sounds.  ?Musculoskeletal:     ?   General: Normal range of motion.  ?   Cervical back: Normal range of motion and neck supple.  ?Skin: ?   General: Skin is warm and dry.  ?Neurological:  ?   Mental Status: She is alert and oriented to person, place, and time.  ?Psychiatric:     ?   Mood and Affect: Mood normal.     ?   Thought Content: Thought content normal.     ?   Judgment: Judgment normal.  ? ? ? ?UC Treatments / Results  ?Labs ?(all labs ordered are listed, but only abnormal results are displayed) ?Labs Reviewed - No data to display ? ?EKG ? ? ?Radiology ?No results found. ? ?Procedures ?Procedures (including critical care time) ? ?Medications Ordered in UC ?Medications - No data to display ? ?Initial Impression / Assessment and Plan / UC Course  ?I have reviewed the triage vital signs and the nursing notes. ? ?Pertinent labs & imaging results that were available during my care of the patient were reviewed by me and considered in my medical decision making (see chart for details). ? ?  ? ?Given duration and worsening course, will treat with Augmentin in addition to continued over-the-counter supportive medications and  home care.  Return for acutely worsening  symptoms. ? ?Final Clinical Impressions(s) / UC Diagnoses  ? ?Final diagnoses:  ?Acute maxillary sinusitis, recurrence not specified  ? ?Discharge Instructions   ?None ?  ? ?ED Prescriptions   ? ? Medication Sig Dispense Auth. Provider  ? amoxicillin-clavulanate (AUGMENTIN) 875-125 MG tablet Take 1 tablet by mouth every 12 (twelve) hours. 14 tablet Particia Nearing, [place]  ? ?  ? ?PDMP not reviewed this encounter. ?  ?Particia Nearing, PA-C ?12/18/21 1930 ? ?

## 2022-01-14 ENCOUNTER — Telehealth (INDEPENDENT_AMBULATORY_CARE_PROVIDER_SITE_OTHER): Payer: BC Managed Care – PPO | Admitting: Obstetrics & Gynecology

## 2022-01-14 ENCOUNTER — Encounter: Payer: Self-pay | Admitting: Obstetrics & Gynecology

## 2022-01-14 DIAGNOSIS — Z6841 Body Mass Index (BMI) 40.0 and over, adult: Secondary | ICD-10-CM | POA: Diagnosis not present

## 2022-01-14 MED ORDER — OZEMPIC (1 MG/DOSE) 4 MG/3ML ~~LOC~~ SOPN: 1.0000 mg | PEN_INJECTOR | SUBCUTANEOUS | 11 refills | Status: AC

## 2022-01-14 NOTE — Progress Notes (Signed)
Virtual Visit via Video Note ? ?I connected with Lindsey Bell on 01/14/22 at  8:55 AM EDT by a video enabled telemedicine application and verified that I am speaking with the correct person using two identifiers. ? ?Location: ?Patient: Work, private room ?Provider: Office ?  ?I discussed the limitations of evaluation and management by telemedicine and the availability of in person appointments. The patient expressed understanding and agreed to proceed. ? ?History of Present Illness: ?Lindsey Bell is a 46 y.o. who was started on Ozempic approximately 2 months ago due to obesity/abnormal weight gain. The patient has lost 11 pounds over the past 2 mos due to this medicine, as well as diet and exercise attention..   She has these side effects: nausea. ? ?PMHx: ?She  has a past medical history of Borderline hypertension and Gestational diabetes (2016). Also,  has a past surgical history that includes Cesarean section (2005); Diagnostic laparoscopy (2015); Dilation and curettage of uterus; Cesarean section (N/A, 10/10/2015); and Tubal ligation (2016)., family history includes Breast cancer (age of onset: 69) in her paternal aunt; Hypertension in her father and mother; Stroke in her maternal grandfather.,  reports that she quit smoking about 10 years ago. Her smoking use included cigarettes. She has a 7.50 pack-year smoking history. She has never used smokeless tobacco. She reports that she does not drink alcohol and does not use drugs. ? ?She has a current medication list which includes the following prescription(s): ozempic (1 mg/dose), amoxicillin-clavulanate, duloxetine, levocetirizine, prednisone, and [DISCONTINUED] trazodone. Also, is allergic to venlafaxine. ? ?ROS ? ?  ?Observations/Objective: ?No exam today, due to telephone eVisit due to [facility] virus restriction on elective visits and procedures.  Prior visits reviewed along with ultrasounds/labs as indicated. ?From Home: There were no vitals taken for  this visit. There is no height or weight on file to calculate BMI. There were no vitals filed for this visit. ? ?Assessment and Plan: ?  ICD-10-CM   ?1. Class 3 severe obesity without serious comorbidity with body mass index (BMI) of 40.0 to 44.9 in adult, unspecified obesity type (HCC)  E66.01   ? Z68.41   ?  ? ?Assessment: obesity ?Medication treatment is going very well for her. ? ?Plan: ?Patient is continued  with Ozempic . Dose increased to 1 mg/ week ? ?Will continue to assist patient in incorporating positive experiences into her life to promote a positive mental attitude.  Education given regarding appropriate lifestyle changes for weight loss, including regular physical activity, healthy coping strategies, caloric restriction, and healthy eating patterns. ? ?Follow Up Instructions: ?As needed and for annual ?  ?I discussed the assessment and treatment plan with the patient. The patient was provided an opportunity to ask questions and all were answered. The patient agreed with the plan and demonstrated an understanding of the instructions. ?  ?The patient was advised to call back or seek an in-person evaluation if the symptoms worsen or if the condition fails to improve as anticipated. ? ?A total of 20 minutes were spent face-to-face with the patient as well as preparation, review, communication, and documentation during this encounter.  ? ?Annamarie Major, MD, FACOG ?[facility], [facility] ?01/14/2022  9:30 AM ? ?

## 2022-02-17 ENCOUNTER — Ambulatory Visit
Admission: RE | Admit: 2022-02-17 | Discharge: 2022-02-17 | Disposition: A | Discharge status: Home / Self Care | Payer: BC Managed Care – PPO | Source: Ambulatory Visit | Attending: Family Medicine | Admitting: Family Medicine

## 2022-02-17 VITALS — BP 129/88 | HR 86 | Temp 98.9°F | Resp 18

## 2022-02-17 DIAGNOSIS — J309 Allergic rhinitis, unspecified: Secondary | ICD-10-CM

## 2022-02-17 MED ORDER — PREDNISONE 20 MG PO TABS: 40.0000 mg | ORAL_TABLET | Freq: Every day | ORAL | 0 refills | Status: DC

## 2022-02-17 NOTE — ED Provider Notes (Signed)
?[facility] ? ? ? ?CSN: [id number] ?Arrival date & time: 02/17/22  1649 ? ? ?  ? ?History   ?Chief Complaint ?Chief Complaint  ?Patient presents with  ? Nasal Congestion  ?  Sinus pain and ear pain - Entered by patient  ? ? ?HPI ?Lindsey Bell is a [age] y.o. female.  ? ?Presenting today with bilateral ear pain and pressure, sinus pressure, feeling like her head is underwater, cough and congestion worse in the morning.  Denies fever, chills, body aches, chest pain, shortness of breath, abdominal pain, nausea vomiting or diarrhea.  No known sick contacts recently.  Trying Tylenol Sinus, Mucinex, Flonase with minimal relief.  History of seasonal allergies on antihistamines as needed. ? ? ?Past Medical History:  ?Diagnosis Date  ? Borderline hypertension   ? Gestational diabetes 2016  ? ? ?Patient Active Problem List  ? Diagnosis Date Noted  ? History of gestational diabetes 05/25/2018  ? Morbid obesity (HCC) 05/25/2018  ? History of cesarean delivery 04/26/2015  ? History of PCOS 04/26/2015  ? Endometriosis determined by laparoscopy 04/26/2015  ? ? ?Past Surgical History:  ?Procedure Laterality Date  ? CESAREAN SECTION  2005  ? CESAREAN SECTION N/A 10/10/2015  ? Procedure: CESAREAN SECTION;  Surgeon: Vena AustriaAndreas Staebler, MD;  Location: [facility] ORS;  Service: Obstetrics;  Laterality: N/A;  ? DIAGNOSTIC LAPAROSCOPY  2015  ? DILATION AND CURETTAGE OF UTERUS    ? TUBAL LIGATION  2016  ? ? ?OB History   ? ? Gravida  ?3  ? Para  ?2  ? Term  ?2  ? Preterm  ?   ? AB  ?1  ? Living  ?0  ?  ? ? SAB  ?1  ? IAB  ?   ? Ectopic  ?   ? Multiple  ?0  ? Live Births  ?   ?   ?  ?  ? ? ? ?Home Medications   ? ?Prior to Admission medications   ?Medication Sig Start Date End Date Taking? Authorizing Provider  ?predniSONE (DELTASONE) 20 MG tablet Take 2 tablets (40 mg total) by mouth daily with breakfast. 02/17/22  Yes Particia NearingLane, Lashona Schaaf Elizabeth, PA-C  ?amoxicillin-clavulanate (AUGMENTIN) 875-125 MG tablet Take 1 tablet by mouth every 12  (twelve) hours. 12/18/21   Particia NearingLane, Keirstyn Aydt Elizabeth, PA-C  ?DULoxetine (CYMBALTA) 20 MG capsule Take 2 capsules (40 mg total) by mouth daily. 08/12/21 08/12/22  Vena AustriaStaebler, Andreas, MD  ?levocetirizine (XYZAL) 5 MG tablet Take 1 tablet (5 mg total) by mouth every evening. 12/12/21   Wallis BambergMani, Mario, PA-C  ?predniSONE (DELTASONE) 50 MG tablet Take 1 tablet (50 mg total) by mouth daily with breakfast. 12/12/21   Wallis BambergMani, Mario, PA-C  ?Semaglutide, 1 MG/DOSE, (OZEMPIC, 1 MG/DOSE,) 4 MG/3ML SOPN Inject 1 mg into the skin once a week. 01/14/22   Nadara MustardHarris, Robert P, MD  ?traZODone (DESYREL) 50 MG tablet Take 1 tablet (50 mg total) by mouth at bedtime as needed for sleep. 10/25/18 08/03/20  Vena AustriaStaebler, Andreas, MD  ? ? ?Family History ?Family History  ?Problem Relation Age of Onset  ? Hypertension Mother   ? Hypertension Father   ? Stroke Maternal Grandfather   ? Breast cancer Paternal Aunt 7060  ? ? ?Social History ?Social History  ? ?Tobacco Use  ? Smoking status: Former  ?  Packs/day: 0.50  ?  Years: 15.00  ?  Pack years: 7.50  ?  Types: Cigarettes  ?  Quit date: 10/21/2011  ?  Years  since quitting: 10.3  ?  Passive exposure: Never  ? Smokeless tobacco: Never  ?Vaping Use  ? Vaping Use: Never used  ?Substance Use Topics  ? Alcohol use: Yes  ?  Comment: Occas  ? Drug use: No  ? ? ? ?Allergies   ?Venlafaxine ? ? ?Review of Systems ?Review of Systems ?Per HPI ? ?Physical Exam ?Triage Vital Signs ?ED Triage Vitals  ?Enc Vitals Group  ?   BP 02/17/22 1714 129/88  ?   Pulse Rate 02/17/22 1714 86  ?   Resp 02/17/22 1714 18  ?   Temp 02/17/22 1714 98.9 ?F (37.2 ?C)  ?   Temp Source 02/17/22 1714 Oral  ?   SpO2 02/17/22 1714 97 %  ?   Weight --   ?   Height --   ?   Head Circumference --   ?   Peak Flow --   ?   Pain Score 02/17/22 1711 6  ?   Pain Loc --   ?   Pain Edu? --   ?   Excl. in GC? --   ? ?No data found. ? ?Updated Vital Signs ?BP 129/88 (BP Location: Right Arm)   Pulse 86   Temp 98.9 ?F (37.2 ?C) (Oral)   Resp 18   LMP 02/06/2022  (Approximate)   SpO2 97%  ? ?Visual Acuity ?Right Eye Distance:   ?Left Eye Distance:   ?Bilateral Distance:   ? ?Right Eye Near:   ?Left Eye Near:    ?Bilateral Near:    ? ?Physical Exam ?Vitals and nursing note reviewed.  ?Constitutional:   ?   Appearance: Normal appearance.  ?HENT:  ?   Head: Atraumatic.  ?   Right Ear: External ear normal.  ?   Left Ear: External ear normal.  ?   Ears:  ?   Comments: Bilateral middle ear effusions ?   Nose: Rhinorrhea present.  ?   Mouth/Throat:  ?   Mouth: Mucous membranes are moist.  ?   Pharynx: Posterior oropharyngeal erythema present.  ?Eyes:  ?   Extraocular Movements: Extraocular movements intact.  ?   Conjunctiva/sclera: Conjunctivae normal.  ?Cardiovascular:  ?   Rate and Rhythm: Normal rate and regular rhythm.  ?   Heart sounds: Normal heart sounds.  ?Pulmonary:  ?   Effort: Pulmonary effort is normal.  ?   Breath sounds: Normal breath sounds. No wheezing or rales.  ?Musculoskeletal:     ?   General: Normal range of motion.  ?   Cervical back: Normal range of motion and neck supple.  ?Skin: ?   General: Skin is warm and dry.  ?Neurological:  ?   Mental Status: She is alert and oriented to person, place, and time.  ?Psychiatric:     ?   Mood and Affect: Mood normal.     ?   Thought Content: Thought content normal.  ? ? ? ?UC Treatments / Results  ?Labs ?(all labs ordered are listed, but only abnormal results are displayed) ?Labs Reviewed - No data to display ? ?EKG ? ? ?Radiology ?No results found. ? ?Procedures ?Procedures (including critical care time) ? ?Medications Ordered in UC ?Medications - No data to display ? ?Initial Impression / Assessment and Plan / UC Course  ?I have reviewed the triage vital signs and the nursing notes. ? ?Pertinent labs & imaging results that were available during my care of the patient were reviewed by me and considered in my  medical decision making (see chart for details). ? ?  ? ?Vital signs benign and reassuring, treat with  prednisone, allergy regimen, over-the-counter supportive medications and home care.  No evidence of a bacterial infection today.  Return for worsening symptoms. ? ?Final Clinical Impressions(s) / UC Diagnoses  ? ?Final diagnoses:  ?Allergic sinusitis  ? ?Discharge Instructions   ?None ?  ? ?ED Prescriptions   ? ? Medication Sig Dispense Auth. Provider  ? predniSONE (DELTASONE) 20 MG tablet Take 2 tablets (40 mg total) by mouth daily with breakfast. 10 tablet Particia Nearing, PA-C  ? ?  ? ?PDMP not reviewed this encounter. ?  ?Particia Nearing, PA-C ?02/17/22 1731 ? ?

## 2022-02-17 NOTE — ED Triage Notes (Signed)
Pt states that she feels like her head is in water in both ears ? ?Pt states she has been dealing sinus issues or about a week ? ?Pt states she has been congested with cough ? ?Pt state she tried Flonase, Tylenol sinus and Mucinex without much relief ? ?Denies Fever ?

## 2022-05-28 ENCOUNTER — Ambulatory Visit
Admission: RE | Admit: 2022-05-28 | Discharge: 2022-05-28 | Disposition: A | Discharge status: Home / Self Care | Payer: BC Managed Care – PPO | Source: Ambulatory Visit | Attending: Nurse Practitioner | Admitting: Nurse Practitioner

## 2022-05-28 ENCOUNTER — Other Ambulatory Visit: Payer: Self-pay

## 2022-05-28 VITALS — BP 134/88 | HR 89 | Temp 99.2°F | Resp 20

## 2022-05-28 DIAGNOSIS — J029 Acute pharyngitis, unspecified: Secondary | ICD-10-CM | POA: Insufficient documentation

## 2022-05-28 LAB — POCT MONO SCREEN (KUC): Mono, POC: NEGATIVE

## 2022-05-28 LAB — POCT RAPID STREP A (OFFICE): Rapid Strep A Screen: NEGATIVE

## 2022-05-28 NOTE — ED Provider Notes (Signed)
[facility]    CSN: [id number] Arrival date & time: 05/28/22  1253      History   Chief Complaint Chief Complaint  Patient presents with   Sore Throat    Entered by patient    HPI Lindsey Bell is a 46 y.o. female.   Patient presents with a couple days of sore throat, noticed bumps on the back of her throat yesterday.  Also endorses body aches since earlier this week.  She denies significant cough, chest pain, shortness of breath, abdominal pain, nausea/vomiting, diarrhea, decreased appetite.  She does have some facial congestion.  No known sick contacts.  Has not taken anything for symptoms.    Past Medical History:  Diagnosis Date   Borderline hypertension    Gestational diabetes 2016    Patient Active Problem List   Diagnosis Date Noted   History of gestational diabetes 05/25/2018   Morbid obesity (HCC) 05/25/2018   History of cesarean delivery 04/26/2015   History of PCOS 04/26/2015   Endometriosis determined by laparoscopy 04/26/2015    Past Surgical History:  Procedure Laterality Date   CESAREAN SECTION  2005   CESAREAN SECTION N/A 10/10/2015   Procedure: CESAREAN SECTION;  Surgeon: [name], MD;  Location: [facility] ORS;  Service: Obstetrics;  Laterality: N/A;   DIAGNOSTIC LAPAROSCOPY  2015   DILATION AND CURETTAGE OF UTERUS     TUBAL LIGATION  2016    OB History     Gravida  3   Para  2   Term  2   Preterm      AB  1   Living  0      SAB  1   IAB      Ectopic      Multiple  0   Live Births               Home Medications    Prior to Admission medications   Medication Sig Start Date End Date Taking? Authorizing Provider  DULoxetine (CYMBALTA) 20 MG capsule Take 2 capsules (40 mg total) by mouth daily. 08/12/21 08/12/22  [name], MD  levocetirizine (XYZAL) 5 MG tablet Take 1 tablet (5 mg total) by mouth every evening. 12/12/21   Wallis Bamberg, PA-C  Semaglutide, 1 MG/DOSE, (OZEMPIC, 1 MG/DOSE,) 4  MG/3ML SOPN Inject 1 mg into the skin once a week. 01/14/22   Nadara Mustard, MD  traZODone (DESYREL) 50 MG tablet Take 1 tablet (50 mg total) by mouth at bedtime as needed for sleep. 10/25/18 08/03/20  [name], MD    Family History Family History  Problem Relation Age of Onset   Hypertension Mother    Hypertension Father    Stroke Maternal Grandfather    Breast cancer Paternal Aunt 1    Social History Social History   Tobacco Use   Smoking status: Former    Packs/day: 0.50    Years: 15.00    Total pack years: 7.50    Types: Cigarettes    Quit date: 10/21/2011    Years since quitting: 10.6    Passive exposure: Never   Smokeless tobacco: Never  Vaping Use   Vaping Use: Never used  Substance Use Topics   Alcohol use: Yes    Comment: Occas   Drug use: No     Allergies   Venlafaxine   Review of Systems Review of Systems Per HPI  Physical Exam Triage Vital Signs ED Triage Vitals  Enc Vitals Group  BP 05/28/22 1306 134/88     Pulse Rate 05/28/22 1306 89     Resp 05/28/22 1306 20     Temp 05/28/22 1306 99.2 F (37.3 C)     Temp Source 05/28/22 1306 Oral     SpO2 05/28/22 1306 99 %     Weight --      Height --      Head Circumference --      Peak Flow --      Pain Score 05/28/22 1307 8     Pain Loc --      Pain Edu? --      Excl. in GC? --    No data found.  Updated Vital Signs BP 134/88 (BP Location: Right Arm)   Pulse 89   Temp 99.2 F (37.3 C) (Oral)   Resp 20   LMP 05/28/2022 (Approximate)   SpO2 99%   Visual Acuity Right Eye Distance:   Left Eye Distance:   Bilateral Distance:    Right Eye Near:   Left Eye Near:    Bilateral Near:     Physical Exam Vitals and nursing note reviewed.  Constitutional:      General: She is not in acute distress.    Appearance: She is well-developed. She is not toxic-appearing.  HENT:     Head: Normocephalic and atraumatic.     Right Ear: Tympanic membrane and ear canal normal. No drainage,  swelling or tenderness. No middle ear effusion. Tympanic membrane is not erythematous.     Left Ear: Tympanic membrane and ear canal normal. No drainage, swelling or tenderness.  No middle ear effusion. Tympanic membrane is not erythematous.     Nose: No congestion or rhinorrhea.     Mouth/Throat:     Mouth: Mucous membranes are moist.     Pharynx: Oropharynx is clear. Uvula midline. No oropharyngeal exudate or posterior oropharyngeal erythema.     Tonsils: No tonsillar exudate. 1+ on the right. 1+ on the left.     Comments: Scattered erythematous lesions to posterior palate Eyes:     Extraocular Movements:     Right eye: Normal extraocular motion.     Left eye: Normal extraocular motion.  Cardiovascular:     Rate and Rhythm: Normal rate and regular rhythm.  Pulmonary:     Effort: Pulmonary effort is normal. No respiratory distress.     Breath sounds: Normal breath sounds. No wheezing, rhonchi or rales.  Musculoskeletal:     Cervical back: Normal range of motion and neck supple.  Lymphadenopathy:     Cervical: No cervical adenopathy.  Skin:    General: Skin is warm and dry.     Capillary Refill: Capillary refill takes less than 2 seconds.     Coloration: Skin is not pale.     Findings: No erythema or rash.  Neurological:     Mental Status: She is alert and oriented to person, place, and time.  Psychiatric:        Behavior: Behavior is cooperative.      UC Treatments / Results  Labs (all labs ordered are listed, but only abnormal results are displayed) Labs Reviewed  CULTURE, GROUP A STREP [facility])  POCT RAPID STREP A (OFFICE)  POCT MONO SCREEN [facility])    EKG   Radiology No results found.  Procedures Procedures (including critical care time)  Medications Ordered in UC Medications - No data to display  Initial Impression / Assessment and Plan / UC Course  I have  reviewed the triage vital signs and the nursing notes.  Pertinent labs & imaging results that were  available during my care of the patient were reviewed by me and considered in my medical decision making (see chart for details).    Patient is a very pleasant, well-appearing 46 year old female presenting for acute pharyngitis today.  Rapid strep screen is negative and mononucleosis screen also negative.  Throat culture pending.  Treat for viral sore throat while awaiting throat culture; supportive care discussed.  Seek care if symptoms persist or worsen despite treatment.  The patient was given the opportunity to ask questions.  All questions answered to their satisfaction.  The patient is in agreement to this plan.   Final Clinical Impressions(s) / UC Diagnoses   Final diagnoses:  Acute pharyngitis, unspecified etiology     Discharge Instructions      - Rapid strep throat test and mononucleosis screen are negative today.  We will contact you if the throat culture comes back positive for strep throat and will prescribe antibiotics at that time.  - In the meantime, please use warm salt water gargles, hot liquids with lemon/honey, Cepacol lozenges or chloraseptic spray to help with throat pain - If symptoms persist or worsen despite treatment, please seek care     ED Prescriptions   None    PDMP not reviewed this encounter.   Valentino Nose, NP 05/28/22 [phone]

## 2022-05-28 NOTE — ED Triage Notes (Signed)
Pt reports sore throat x2 days and "red bumps" to roof of mouth since yesterday. Pt denies any known fevers or other symptoms.

## 2022-05-28 NOTE — Discharge Instructions (Addendum)
-   Rapid strep throat test and mononucleosis screen are negative today.  We will contact you if the throat culture comes back positive for strep throat and will prescribe antibiotics at that time.  - In the meantime, please use warm salt water gargles, hot liquids with lemon/honey, Cepacol lozenges or chloraseptic spray to help with throat pain - If symptoms persist or worsen despite treatment, please seek care

## 2022-05-31 LAB — CULTURE, GROUP A STREP (THRC)

## 2022-07-15 ENCOUNTER — Other Ambulatory Visit: Payer: Self-pay | Admitting: Obstetrics and Gynecology

## 2022-07-15 DIAGNOSIS — Z1231 Encounter for screening mammogram for malignant neoplasm of breast: Secondary | ICD-10-CM

## 2022-08-05 ENCOUNTER — Ambulatory Visit
Admission: RE | Admit: 2022-08-05 | Discharge: 2022-08-05 | Disposition: A | Discharge status: Home / Self Care | Payer: BC Managed Care – PPO | Source: Ambulatory Visit | Attending: Obstetrics and Gynecology | Admitting: Obstetrics and Gynecology

## 2022-08-05 DIAGNOSIS — Z1231 Encounter for screening mammogram for malignant neoplasm of breast: Secondary | ICD-10-CM | POA: Insufficient documentation

## 2022-09-24 ENCOUNTER — Ambulatory Visit
Admission: EM | Admit: 2022-09-24 | Discharge: 2022-09-24 | Disposition: A | Discharge status: Home / Self Care | Payer: BC Managed Care – PPO | Attending: Family Medicine | Admitting: Family Medicine

## 2022-09-24 ENCOUNTER — Encounter: Payer: Self-pay | Admitting: Emergency Medicine

## 2022-09-24 DIAGNOSIS — Z20822 Contact with and (suspected) exposure to covid-19: Secondary | ICD-10-CM | POA: Diagnosis not present

## 2022-09-24 DIAGNOSIS — J029 Acute pharyngitis, unspecified: Secondary | ICD-10-CM | POA: Diagnosis present

## 2022-09-24 DIAGNOSIS — J069 Acute upper respiratory infection, unspecified: Secondary | ICD-10-CM | POA: Diagnosis not present

## 2022-09-24 DIAGNOSIS — R509 Fever, unspecified: Secondary | ICD-10-CM | POA: Insufficient documentation

## 2022-09-24 DIAGNOSIS — Z87891 Personal history of nicotine dependence: Secondary | ICD-10-CM | POA: Insufficient documentation

## 2022-09-24 LAB — RESP PANEL BY RT-PCR (FLU A&B, COVID) ARPGX2
Influenza A by PCR: NEGATIVE
Influenza B by PCR: NEGATIVE
SARS Coronavirus 2 by RT PCR: NEGATIVE

## 2022-09-24 MED ORDER — PROMETHAZINE-DM 6.25-15 MG/5ML PO SYRP: 5.0000 mL | ORAL_SOLUTION | Freq: Four times a day (QID) | ORAL | 0 refills | Status: AC | PRN

## 2022-09-24 MED ORDER — FLUTICASONE PROPIONATE 50 MCG/ACT NA SUSP: 1.0000 | Freq: Two times a day (BID) | NASAL | 2 refills | Status: AC

## 2022-09-24 NOTE — ED Provider Notes (Signed)
[facility]    CSN: [id number] Arrival date & time: 09/24/22  0802      History   Chief Complaint No chief complaint on file.   HPI Lindsey Bell is a 46 y.o. female.   Presenting today with 2-day history of sore throat, cough, sinus drainage, congestion, fever, chills, body aches, headache.  Denies chest pain, shortness of breath, abdominal pain, nausea vomiting or diarrhea.  Trying DayQuil with minimal relief of symptoms.  No known sick contacts or pertinent chronic medical problems.    Past Medical History:  Diagnosis Date   Borderline hypertension    Gestational diabetes 2016    Patient Active Problem List   Diagnosis Date Noted   History of gestational diabetes 05/25/2018   Morbid obesity (HCC) 05/25/2018   History of cesarean delivery 04/26/2015   History of PCOS 04/26/2015   Endometriosis determined by laparoscopy 04/26/2015    Past Surgical History:  Procedure Laterality Date   CESAREAN SECTION  2005   CESAREAN SECTION N/A 10/10/2015   Procedure: CESAREAN SECTION;  Surgeon: [name], MD;  Location: [facility] ORS;  Service: Obstetrics;  Laterality: N/A;   DIAGNOSTIC LAPAROSCOPY  2015   DILATION AND CURETTAGE OF UTERUS     TUBAL LIGATION  2016    OB History     Gravida  3   Para  2   Term  2   Preterm      AB  1   Living  0      SAB  1   IAB      Ectopic      Multiple  0   Live Births               Home Medications    Prior to Admission medications   Medication Sig Start Date End Date Taking? Authorizing Provider  fluticasone (FLONASE) 50 MCG/ACT nasal spray Place 1 spray into both nostrils 2 (two) times daily. 09/24/22  Yes Particia Nearing, PA-C  promethazine-dextromethorphan (PROMETHAZINE-DM) 6.25-15 MG/5ML syrup Take 5 mLs by mouth 4 (four) times daily as needed. 09/24/22  Yes Particia Nearing, PA-C  DULoxetine (CYMBALTA) 20 MG capsule Take 2 capsules (40 mg total) by mouth daily. 08/12/21  08/12/22  [name], MD  levocetirizine (XYZAL) 5 MG tablet Take 1 tablet (5 mg total) by mouth every evening. 12/12/21   Wallis Bamberg, PA-C  Semaglutide, 1 MG/DOSE, (OZEMPIC, 1 MG/DOSE,) 4 MG/3ML SOPN Inject 1 mg into the skin once a week. 01/14/22   Nadara Mustard, MD  traZODone (DESYREL) 50 MG tablet Take 1 tablet (50 mg total) by mouth at bedtime as needed for sleep. 10/25/18 08/03/20  [name], MD    Family History Family History  Problem Relation Age of Onset   Hypertension Mother    Hypertension Father    Stroke Maternal Grandfather    Breast cancer Paternal Aunt 7    Social History Social History   Tobacco Use   Smoking status: Former    Packs/day: 0.50    Years: 15.00    Total pack years: 7.50    Types: Cigarettes    Quit date: 10/21/2011    Years since quitting: 10.9    Passive exposure: Never   Smokeless tobacco: Never  Vaping Use   Vaping Use: Never used  Substance Use Topics   Alcohol use: Yes    Comment: Occas   Drug use: No     Allergies   Venlafaxine   Review  of Systems Review of Systems Per HPI  Physical Exam Triage Vital Signs ED Triage Vitals  Enc Vitals Group     BP 09/24/22 0814 127/88     Pulse Rate 09/24/22 0814 86     Resp 09/24/22 0814 18     Temp 09/24/22 0814 98 F (36.7 C)     Temp Source 09/24/22 0814 Oral     SpO2 09/24/22 0814 98 %     Weight --      Height --      Head Circumference --      Peak Flow --      Pain Score 09/24/22 0815 3     Pain Loc --      Pain Edu? --      Excl. in [place]? --    No data found.  Updated Vital Signs BP 127/88 (BP Location: Right Arm)   Pulse 86   Temp 98 F (36.7 C) (Oral)   Resp 18   LMP 09/19/2022 (Exact Date)   SpO2 98%   Visual Acuity Right Eye Distance:   Left Eye Distance:   Bilateral Distance:    Right Eye Near:   Left Eye Near:    Bilateral Near:     Physical Exam Vitals and nursing note reviewed.  Constitutional:      Appearance: Normal appearance.   HENT:     Head: Atraumatic.     Right Ear: Tympanic membrane and external ear normal.     Left Ear: Tympanic membrane and external ear normal.     Nose: Rhinorrhea present.     Mouth/Throat:     Mouth: Mucous membranes are moist.     Pharynx: Posterior oropharyngeal erythema present.  Eyes:     Extraocular Movements: Extraocular movements intact.     Conjunctiva/sclera: Conjunctivae normal.  Cardiovascular:     Rate and Rhythm: Normal rate and regular rhythm.     Heart sounds: Normal heart sounds.  Pulmonary:     Effort: Pulmonary effort is normal.     Breath sounds: Normal breath sounds. No wheezing.  Musculoskeletal:        General: Normal range of motion.     Cervical back: Normal range of motion and neck supple.  Lymphadenopathy:     Cervical: No cervical adenopathy.  Skin:    General: Skin is warm and dry.  Neurological:     Mental Status: She is alert and oriented to person, place, and time.  Psychiatric:        Mood and Affect: Mood normal.        Thought Content: Thought content normal.      UC Treatments / Results  Labs (all labs ordered are listed, but only abnormal results are displayed) Labs Reviewed  RESP PANEL BY RT-PCR (FLU A&B, COVID) [id number]    EKG   Radiology No results found.  Procedures Procedures (including critical care time)  Medications Ordered in UC Medications - No data to display  Initial Impression / Assessment and Plan / UC Course  I have reviewed the triage vital signs and the nursing notes.  Pertinent labs & imaging results that were available during my care of the patient were reviewed by me and considered in my medical decision making (see chart for details).     Vital signs and exam overall reassuring today and suggestive of a viral upper respiratory infection.  Respiratory panel pending, treat with Phenergan DM, Flonase, supportive over-the-counter medications and home care.  Return for  worsening symptoms.  Final  Clinical Impressions(s) / UC Diagnoses   Final diagnoses:  Viral URI with cough  Fever, unspecified   Discharge Instructions   None    ED Prescriptions     Medication Sig Dispense Auth. Provider   promethazine-dextromethorphan (PROMETHAZINE-DM) 6.25-15 MG/5ML syrup Take 5 mLs by mouth 4 (four) times daily as needed. 100 mL Volney American, PA-C   fluticasone [facility]) 50 MCG/ACT nasal spray Place 1 spray into both nostrils 2 (two) times daily. 16 g Volney American, [place]      PDMP not reviewed this encounter.   Volney American, [place] 09/24/22 [phone]

## 2022-09-24 NOTE — ED Triage Notes (Signed)
Headache x 2 days.  Sore throat that started today.  Has been having sinus drainage, body aches with cough since yesterday.  Has been using dayquil to treat symptoms

## 2022-10-06 ENCOUNTER — Ambulatory Visit
Admission: EM | Admit: 2022-10-06 | Discharge: 2022-10-06 | Disposition: A | Discharge status: Home / Self Care | Payer: BC Managed Care – PPO | Attending: Nurse Practitioner | Admitting: Nurse Practitioner

## 2022-10-06 DIAGNOSIS — R399 Unspecified symptoms and signs involving the genitourinary system: Secondary | ICD-10-CM | POA: Diagnosis present

## 2022-10-06 LAB — POCT URINALYSIS DIP (MANUAL ENTRY)
Bilirubin, UA: NEGATIVE
Blood, UA: NEGATIVE
Glucose, UA: NEGATIVE mg/dL
Ketones, POC UA: NEGATIVE mg/dL
Nitrite, UA: NEGATIVE
Protein Ur, POC: NEGATIVE mg/dL
Spec Grav, UA: 1.02 (ref 1.010–1.025)
Urobilinogen, UA: 0.2 E.U./dL
pH, UA: 5.5 (ref 5.0–8.0)

## 2022-10-06 MED ORDER — PHENAZOPYRIDINE HCL 100 MG PO TABS: 100.0000 mg | ORAL_TABLET | Freq: Three times a day (TID) | ORAL | 0 refills | Status: AC | PRN

## 2022-10-06 MED ORDER — FLUCONAZOLE 150 MG PO TABS: 150.0000 mg | ORAL_TABLET | Freq: Once | ORAL | 0 refills | Status: AC

## 2022-10-06 MED ORDER — CEPHALEXIN 500 MG PO CAPS: 500.0000 mg | ORAL_CAPSULE | Freq: Two times a day (BID) | ORAL | 0 refills | Status: AC

## 2022-10-06 NOTE — ED Provider Notes (Signed)
[facility]    CSN: [id number] Arrival date & time: 10/06/22  0804      History   Chief Complaint Chief Complaint  Patient presents with   Urinary Frequency   URI    HPI Lindsey Bell is a 46 y.o. female.   The history is provided by the patient.   The patient presents with a 1 week history of urinary symptoms.  Symptoms include dysuria, urinary frequency, lower abdominal pain, and low back pain.  Patient denies fever, chills, flank pain, hematuria, decreased urine stream, or vaginal symptoms.  Patient reports that she did have some left over Macrobid that she started taking approximately 1 week ago.  She states that she took 3 days, but ran out of the medication.  She states that she initially felt better, but symptoms started again.  She reports that has been sometime since she has had a urinary tract infection.  She denies history of kidney stones.  Past Medical History:  Diagnosis Date   Borderline hypertension    Gestational diabetes 2016    Patient Active Problem List   Diagnosis Date Noted   History of gestational diabetes 05/25/2018   Morbid obesity (HCC) 05/25/2018   History of cesarean delivery 04/26/2015   History of PCOS 04/26/2015   Endometriosis determined by laparoscopy 04/26/2015    Past Surgical History:  Procedure Laterality Date   CESAREAN SECTION  2005   CESAREAN SECTION N/A 10/10/2015   Procedure: CESAREAN SECTION;  Surgeon: [name], MD;  Location: [facility];  Service: Obstetrics;  Laterality: N/A;   DIAGNOSTIC LAPAROSCOPY  2015   DILATION AND CURETTAGE OF UTERUS     TUBAL LIGATION  2016    OB History     Gravida  3   Para  2   Term  2   Preterm      AB  1   Living  0      SAB  1   IAB      Ectopic      Multiple  0   Live Births               Home Medications    Prior to Admission medications   Medication Sig Start Date End Date Taking? Authorizing Provider  cephALEXin (KEFLEX) 500 MG  capsule Take 1 capsule (500 mg total) by mouth 2 (two) times daily for 7 days. 10/06/22 10/13/22 Yes Anaika Santillano-Warren, Sadie Haber, NP  fluconazole (DIFLUCAN) 150 MG tablet Take 1 tablet (150 mg total) by mouth once for 1 dose. 10/06/22 10/06/22 Yes Zoey Bidwell-Warren, Sadie Haber, NP  fluticasone (FLONASE) 50 MCG/ACT nasal spray Place 1 spray into both nostrils 2 (two) times daily. 09/24/22  Yes Particia Nearing, PA-C  levocetirizine (XYZAL) 5 MG tablet Take 1 tablet (5 mg total) by mouth every evening. 12/12/21  Yes Wallis Bamberg, PA-C  phenazopyridine (PYRIDIUM) 100 MG tablet Take 1 tablet (100 mg total) by mouth 3 (three) times daily as needed for pain. 10/06/22  Yes Corita Allinson-Warren, Sadie Haber, NP  DULoxetine (CYMBALTA) 20 MG capsule Take 2 capsules (40 mg total) by mouth daily. 08/12/21 08/12/22  [name], MD  promethazine-dextromethorphan (PROMETHAZINE-DM) 6.25-15 MG/5ML syrup Take 5 mLs by mouth 4 (four) times daily as needed. 09/24/22   Particia Nearing, PA-C  Semaglutide, 1 MG/DOSE, (OZEMPIC, 1 MG/DOSE,) 4 MG/3ML SOPN Inject 1 mg into the skin once a week. 01/14/22   Nadara Mustard, MD  traZODone (DESYREL) 50 MG tablet Take 1  tablet (50 mg total) by mouth at bedtime as needed for sleep. 10/25/18 08/03/20  [name], MD    Family History Family History  Problem Relation Age of Onset   Hypertension Mother    Hypertension Father    Stroke Maternal Grandfather    Breast cancer Paternal Aunt 50    Social History Social History   Tobacco Use   Smoking status: Former    Packs/day: 0.50    Years: 15.00    Total pack years: 7.50    Types: Cigarettes    Quit date: 10/21/2011    Years since quitting: 10.9    Passive exposure: Never   Smokeless tobacco: Never  Vaping Use   Vaping Use: Never used  Substance Use Topics   Alcohol use: Yes    Comment: Occas   Drug use: No     Allergies   Venlafaxine   Review of Systems Review of Systems Per HPI  Physical Exam Triage  Vital Signs ED Triage Vitals [10/06/22 0821]  Enc Vitals Group     BP (!) 150/99     Pulse Rate 86     Resp 16     Temp 98.4 F (36.9 C)     Temp Source Oral     SpO2 100 %     Weight      Height      Head Circumference      Peak Flow      Pain Score 5     Pain Loc      Pain Edu?      Excl. in GC?    No data found.  Updated Vital Signs BP (!) 150/99 (BP Location: Right Arm)   Pulse 86   Temp 98.4 F (36.9 C) (Oral)   Resp 16   LMP 09/19/2022 (Exact Date)   SpO2 100%   Visual Acuity Right Eye Distance:   Left Eye Distance:   Bilateral Distance:    Right Eye Near:   Left Eye Near:    Bilateral Near:     Physical Exam Vitals and nursing note reviewed.  Constitutional:      General: She is not in acute distress.    Appearance: Normal appearance.  HENT:     Head: Normocephalic.  Eyes:     Extraocular Movements: Extraocular movements intact.     Pupils: Pupils are equal, round, and reactive to light.  Cardiovascular:     Rate and Rhythm: Regular rhythm.     Pulses: Normal pulses.     Heart sounds: Normal heart sounds.  Pulmonary:     Effort: Pulmonary effort is normal. No respiratory distress.     Breath sounds: Normal breath sounds. No stridor. No wheezing, rhonchi or rales.  Abdominal:     General: Bowel sounds are normal. There is no distension.     Palpations: Abdomen is soft.     Tenderness: There is abdominal tenderness in the suprapubic area. There is no right CVA tenderness or left CVA tenderness.  Musculoskeletal:     Cervical back: Normal range of motion.  Lymphadenopathy:     Cervical: No cervical adenopathy.  Skin:    General: Skin is warm and dry.  Neurological:     General: No focal deficit present.     Mental Status: She is alert and oriented to person, place, and time.  Psychiatric:        Mood and Affect: Mood normal.        Behavior: Behavior normal.  UC Treatments / Results  Labs (all labs ordered are listed, but only  abnormal results are displayed) Labs Reviewed  POCT URINALYSIS DIP (MANUAL ENTRY) - Abnormal; Notable for the following components:      Result Value   Leukocytes, UA Small (1+) (*)    All other components within normal limits  URINE CULTURE    EKG   Radiology No results found.  Procedures Procedures (including critical care time)  Medications Ordered in UC Medications - No data to display  Initial Impression / Assessment and Plan / UC Course  I have reviewed the triage vital signs and the nursing notes.  Pertinent labs & imaging results that were available during my care of the patient were reviewed by me and considered in my medical decision making (see chart for details).  The patient is well-appearing, she is in no acute distress, vital signs are stable.  Urinalysis does show small leukocytes.  Symptoms are also consistent with a urinary tract infection.  Given that patient has been on Macrobid, but did not have a full course of medication, it is likely that her symptoms have been Mast at this time.  Urine culture is pending.  There is no suspicion for pyelonephritis as patient does not have any flank pain, and her vital signs are stable, despite being hypertensive.  Will treat patient empirically with Keflex 500 mg twice daily for 7 days.  Patient was also provided a prescription for Pyridium 100 mg for dysuria and Diflucan 150 mg for coverage for vaginal symptoms after the antibiotic.  Supportive care recommendations were provided to the patient to include increasing fluids, voiding every 2 hours, and voiding 15 to 20 minutes after sexual intercourse.  Patient was given ER precautions.  Patient was advised that she will be contacted if the urine culture results are negative, or if the antibiotic needs to be changed.  Patient verbalizes understanding.  All questions were answered.  Patient stable for discharge. Final Clinical Impressions(s) / UC Diagnoses   Final diagnoses:   Urinary tract infection symptoms     Discharge Instructions      -The urinalysis shows leukocytes. Along with your symptoms, and the urinalysis results, I will treat you with Keflex. A urine culture is pending. You will be contacted if the culture results are negative or if the antibiotic needs to be changed.  -Take medications as prescribed. -Increase fluids. -Ibuprofen or Tylenol for pain, fever, or general discomfort. -Develop a toileting schedule that will allow you to toilet at least every 2 hours. -Avoid caffeine to include tea, soda, and coffee. -If sexually active, void at least 15 to 20 minutes after sexual intercourse. -Follow-up in the emergency department if you develop fever, chills, worsening abdominal pain, or other concerns.      ED Prescriptions     Medication Sig Dispense Auth. Provider   cephALEXin (KEFLEX) 500 MG capsule Take 1 capsule (500 mg total) by mouth 2 (two) times daily for 7 days. 14 capsule Lamoine Fredricksen-Warren, Sadie Haber, NP   fluconazole (DIFLUCAN) 150 MG tablet Take 1 tablet (150 mg total) by mouth once for 1 dose. 1 tablet Sigmund Morera-Warren, Sadie Haber, NP   phenazopyridine (PYRIDIUM) 100 MG tablet Take 1 tablet (100 mg total) by mouth 3 (three) times daily as needed for pain. 10 tablet Dorlene Footman-Warren, Sadie Haber, NP      PDMP not reviewed this encounter.   Abran Cantor, NP 10/06/22 [phone]

## 2022-10-06 NOTE — ED Triage Notes (Signed)
Increase urinary frequency, lower abdominal pain, burning when urinate, lower back pain. Did have some left over antibiotics at home and has been taking them but is now out, took nitrofurantion started that on Monday, Tuesday and Wednesday last week then ran out. It helped but now symptoms have came back.

## 2022-10-06 NOTE — Discharge Instructions (Addendum)
-  The urinalysis shows leukocytes. Along with your symptoms, and the urinalysis results, I will treat you with Keflex. A urine culture is pending. You will be contacted if the culture results are negative or if the antibiotic needs to be changed.  -Take medications as prescribed. -Increase fluids. -Ibuprofen or Tylenol for pain, fever, or general discomfort. -Develop a toileting schedule that will allow you to toilet at least every 2 hours. -Avoid caffeine to include tea, soda, and coffee. -If sexually active, void at least 15 to 20 minutes after sexual intercourse. -Follow-up in the emergency department if you develop fever, chills, worsening abdominal pain, or other concerns.

## 2022-10-08 LAB — URINE CULTURE

## 2023-06-18 ENCOUNTER — Ambulatory Visit: Payer: Self-pay

## 2023-07-16 ENCOUNTER — Other Ambulatory Visit: Payer: Self-pay | Admitting: Obstetrics and Gynecology

## 2023-07-16 DIAGNOSIS — Z1231 Encounter for screening mammogram for malignant neoplasm of breast: Secondary | ICD-10-CM

## 2023-08-12 ENCOUNTER — Ambulatory Visit
Admission: RE | Admit: 2023-08-12 | Discharge: 2023-08-12 | Disposition: A | Discharge status: Home / Self Care | Payer: BC Managed Care – PPO | Source: Ambulatory Visit | Attending: Obstetrics and Gynecology | Admitting: Obstetrics and Gynecology

## 2023-08-12 DIAGNOSIS — Z1231 Encounter for screening mammogram for malignant neoplasm of breast: Secondary | ICD-10-CM | POA: Diagnosis present

## 2023-09-20 ENCOUNTER — Other Ambulatory Visit: Payer: Self-pay

## 2023-09-20 ENCOUNTER — Ambulatory Visit
Admission: EM | Admit: 2023-09-20 | Discharge: 2023-09-20 | Disposition: A | Discharge status: Home / Self Care | Payer: BC Managed Care – PPO

## 2023-09-20 ENCOUNTER — Encounter: Payer: Self-pay | Admitting: Emergency Medicine

## 2023-09-20 DIAGNOSIS — R102 Pelvic and perineal pain: Secondary | ICD-10-CM

## 2023-09-20 DIAGNOSIS — Z8742 Personal history of other diseases of the female genital tract: Secondary | ICD-10-CM | POA: Diagnosis not present

## 2023-09-20 LAB — POCT URINALYSIS DIP (MANUAL ENTRY)
Bilirubin, UA: NEGATIVE
Blood, UA: NEGATIVE
Glucose, UA: NEGATIVE mg/dL
Ketones, POC UA: NEGATIVE mg/dL
Leukocytes, UA: NEGATIVE
Nitrite, UA: NEGATIVE
Protein Ur, POC: NEGATIVE mg/dL
Spec Grav, UA: 1.02 (ref 1.010–1.025)
Urobilinogen, UA: 0.2 U/dL
pH, UA: 7 (ref 5.0–8.0)

## 2023-09-20 NOTE — Discharge Instructions (Signed)
Your urinalysis was reassuring today with no evidence of urinary tract infection.  You may take over-the-counter pain relievers, use heating pads and follow-up for worsening symptoms

## 2023-09-20 NOTE — ED Provider Notes (Signed)
[facility]    CSN: [id number] Arrival date & time: 09/20/23  1020      History   Chief Complaint Chief Complaint  Patient presents with   Back Pain    HPI Lindsey Bell is a 47 y.o. female.   Patient presenting today with 3-day history of lower back aching, and now radiation of pain to the lower abdomen diffusely, urinary frequency and pressure post urination.  Denies fever, chills, dysuria, hematuria, vaginal symptoms, nausea, vomiting.  She does have a history of urinary tract infections but also endometriosis and states they often feel very similar so she is never sure which one it may be.  So far not tried anything over-the-counter for symptoms.    Past Medical History:  Diagnosis Date   Borderline hypertension    Gestational diabetes 2016    Patient Active Problem List   Diagnosis Date Noted   History of gestational diabetes 05/25/2018   Morbid obesity (HCC) 05/25/2018   History of cesarean delivery 04/26/2015   History of PCOS 04/26/2015   Endometriosis determined by laparoscopy 04/26/2015    Past Surgical History:  Procedure Laterality Date   CESAREAN SECTION  2005   CESAREAN SECTION N/A 10/10/2015   Procedure: CESAREAN SECTION;  Surgeon: [name], MD;  Location: [facility] ORS;  Service: Obstetrics;  Laterality: N/A;   DIAGNOSTIC LAPAROSCOPY  2015   DILATION AND CURETTAGE OF UTERUS     TUBAL LIGATION  2016    OB History     Gravida  3   Para  2   Term  2   Preterm      AB  1   Living  0      SAB  1   IAB      Ectopic      Multiple  0   Live Births               Home Medications    Prior to Admission medications   Medication Sig Start Date End Date Taking? Authorizing Provider  DULoxetine (CYMBALTA) 20 MG capsule Take 2 capsules (40 mg total) by mouth daily. 08/12/21 08/12/22  [name], MD  fluticasone (FLONASE) 50 MCG/ACT nasal spray Place 1 spray into both nostrils 2 (two) times daily. 09/24/22    Particia Nearing, PA-C  hydrochlorothiazide (HYDRODIURIL) 25 MG tablet Take 25 mg by mouth daily.    [provider]  levocetirizine (XYZAL) 5 MG tablet Take 1 tablet (5 mg total) by mouth every evening. 12/12/21   Wallis Bamberg, PA-C  phenazopyridine (PYRIDIUM) 100 MG tablet Take 1 tablet (100 mg total) by mouth 3 (three) times daily as needed for pain. 10/06/22   Leath-Warren, Sadie Haber, NP  promethazine-dextromethorphan (PROMETHAZINE-DM) 6.25-15 MG/5ML syrup Take 5 mLs by mouth 4 (four) times daily as needed. 09/24/22   Particia Nearing, PA-C  Semaglutide, 1 MG/DOSE, (OZEMPIC, 1 MG/DOSE,) 4 MG/3ML SOPN Inject 1 mg into the skin once a week. 01/14/22   Nadara Mustard, MD  traZODone (DESYREL) 50 MG tablet Take 1 tablet (50 mg total) by mouth at bedtime as needed for sleep. 10/25/18 08/03/20  [name], MD    Family History Family History  Problem Relation Age of Onset   Hypertension Mother    Hypertension Father    Stroke Maternal Grandfather    Breast cancer Paternal Aunt 106    Social History Social History   Tobacco Use   Smoking status: Former    Current packs/day:  0.00    Average packs/day: 0.5 packs/day for 15.0 years (7.5 ttl pk-yrs)    Types: Cigarettes    Start date: 10/20/1996    Quit date: 10/21/2011    Years since quitting: 11.9    Passive exposure: Never   Smokeless tobacco: Never  Vaping Use   Vaping status: Never Used  Substance Use Topics   Alcohol use: Yes    Comment: Occas   Drug use: No     Allergies   Venlafaxine   Review of Systems Review of Systems PER HPI  Physical Exam Triage Vital Signs ED Triage Vitals  Encounter Vitals Group     BP 09/20/23 1038 137/84     Systolic BP Percentile --      Diastolic BP Percentile --      Pulse Rate 09/20/23 1038 82     Resp 09/20/23 1038 20     Temp 09/20/23 1038 98.5 F (36.9 C)     Temp Source 09/20/23 1038 Oral     SpO2 09/20/23 1038 98 %     Weight --      Height --       Head Circumference --      Peak Flow --      Pain Score 09/20/23 1032 5     Pain Loc --      Pain Education --      Exclude from Growth Chart --    No data found.  Updated Vital Signs BP 137/84 (BP Location: Right Arm)   Pulse 82   Temp 98.5 F (36.9 C) (Oral)   Resp 20   LMP 09/13/2023 (Approximate)   SpO2 98%   Visual Acuity Right Eye Distance:   Left Eye Distance:   Bilateral Distance:    Right Eye Near:   Left Eye Near:    Bilateral Near:     Physical Exam Vitals and nursing note reviewed.  Constitutional:      Appearance: Normal appearance. She is not ill-appearing.  HENT:     Head: Atraumatic.     Mouth/Throat:     Mouth: Mucous membranes are moist.  Eyes:     Extraocular Movements: Extraocular movements intact.     Conjunctiva/sclera: Conjunctivae normal.  Cardiovascular:     Rate and Rhythm: Normal rate and regular rhythm.     Heart sounds: Normal heart sounds.  Pulmonary:     Effort: Pulmonary effort is normal.     Breath sounds: Normal breath sounds.  Abdominal:     General: Bowel sounds are normal. There is no distension.     Palpations: Abdomen is soft.     Tenderness: There is no abdominal tenderness. There is no right CVA tenderness, left CVA tenderness or guarding.  Musculoskeletal:        General: Normal range of motion.     Cervical back: Normal range of motion and neck supple.  Skin:    General: Skin is warm and dry.  Neurological:     Mental Status: She is alert and oriented to person, place, and time.     Motor: No weakness.     Gait: Gait normal.  Psychiatric:        Mood and Affect: Mood normal.        Thought Content: Thought content normal.        Judgment: Judgment normal.      UC Treatments / Results  Labs (all labs ordered are listed, but only abnormal results are displayed) Labs Reviewed  POCT URINALYSIS DIP (MANUAL ENTRY)    EKG   Radiology No results found.  Procedures Procedures (including critical care  time)  Medications Ordered in UC Medications - No data to display  Initial Impression / Assessment and Plan / UC Course  I have reviewed the triage vital signs and the nursing notes.  Pertinent labs & imaging results that were available during my care of the patient were reviewed by me and considered in my medical decision making (see chart for details).     Vital signs and exam very reassuring today.  Urinalysis with no evidence of urinary tract infection.  Suspect endometriosis flare given patient's history and similar episodes.  Discussed over-the-counter pain relievers, heating pad, OB/GYN follow-up if not improving.  Final Clinical Impressions(s) / UC Diagnoses   Final diagnoses:  Suprapubic pain  History of endometriosis     Discharge Instructions      Your urinalysis was reassuring today with no evidence of urinary tract infection.  You may take over-the-counter pain relievers, use heating pads and follow-up for worsening symptoms   ED Prescriptions   None    PDMP not reviewed this encounter.   Particia Nearing, [place] 09/20/23 1118

## 2023-09-20 NOTE — ED Triage Notes (Signed)
Pt reports lower back pain x3 days. Pt reports intermittent radiation of pain to bladder. Pt reports urinary frequency and pressure post urinary void.

## 2023-09-21 ENCOUNTER — Ambulatory Visit: Payer: Self-pay

## 2023-10-15 ENCOUNTER — Ambulatory Visit
Admission: RE | Admit: 2023-10-15 | Discharge: 2023-10-15 | Disposition: A | Discharge status: Home / Self Care | Payer: BC Managed Care – PPO | Source: Ambulatory Visit

## 2023-10-15 ENCOUNTER — Ambulatory Visit (INDEPENDENT_AMBULATORY_CARE_PROVIDER_SITE_OTHER): Payer: BC Managed Care – PPO

## 2023-10-15 ENCOUNTER — Other Ambulatory Visit: Payer: Self-pay

## 2023-10-15 VITALS — BP 146/92 | HR 74 | Temp 98.1°F | Resp 17

## 2023-10-15 DIAGNOSIS — R059 Cough, unspecified: Secondary | ICD-10-CM

## 2023-10-15 DIAGNOSIS — J029 Acute pharyngitis, unspecified: Secondary | ICD-10-CM

## 2023-10-15 HISTORY — DX: Endometriosis, unspecified: N80.9

## 2023-10-15 LAB — POC COVID19/FLU A&B COMBO
Covid Antigen, POC: NEGATIVE
Influenza A Antigen, POC: NEGATIVE
Influenza B Antigen, POC: NEGATIVE

## 2023-10-15 LAB — POCT RAPID STREP A (OFFICE): Rapid Strep A Screen: NEGATIVE

## 2023-10-15 MED ORDER — LIDOCAINE VISCOUS HCL 2 % MT SOLN: OROMUCOSAL | 0 refills | Status: AC

## 2023-10-15 MED ORDER — PSEUDOEPH-BROMPHEN-DM 30-2-10 MG/5ML PO SYRP: 5.0000 mL | ORAL_SOLUTION | Freq: Four times a day (QID) | ORAL | 0 refills | Status: AC | PRN

## 2023-10-15 MED ORDER — PREDNISONE 20 MG PO TABS: 40.0000 mg | ORAL_TABLET | Freq: Every day | ORAL | 0 refills | Status: AC

## 2023-10-15 NOTE — Discharge Instructions (Addendum)
The COVID/flu test and rapid strep test were negative.  A throat culture is pending.  You will be contacted if the pending test result is positive.  You also have access to the results via MyChart. Chest x-ray is also pending.  You will be contacted if the result of the x-ray is abnormal. Take medication as prescribed. Increase fluids and allow for plenty of rest. Recommend over-the-counter ibuprofen or Tylenol as needed for pain, fever, or general discomfort. For your cough, recommend using a humidifier at nighttime during sleep and sleeping slightly elevated on pillows while cough symptoms persist. If symptoms do not improve, or appear to be worsening, you may follow-up in this clinic or with your primary care physician for further evaluation. Follow-up as needed.

## 2023-10-15 NOTE — ED Provider Notes (Signed)
[facility]    CSN: [id number] Arrival date & time: 10/15/23  1116      History   Chief Complaint Chief Complaint  Patient presents with   Sore Throat    Entered by patient    HPI Lindsey Bell is a 47 y.o. female.   The history is provided by the patient.   Patient presents for complaints of cough and sore throat.  Patient states cough is been present for almost 2 months, with sore throat developing over the past several days.  She states the pain progressively worsened last evening, making it difficult for her to swallow.  She also states that she did have a low-grade temperature around 99.8.  Denies fever, chills, headache, ear pain, wheezing, difficulty breathing, chest pain, abdominal pain, nausea, vomiting, diarrhea, or rash.  Reports that she did take Tylenol earlier today which has helped with her throat pain. Past Medical History:  Diagnosis Date   Borderline hypertension    Endometriosis    Gestational diabetes 10/20/2014    Patient Active Problem List   Diagnosis Date Noted   History of gestational diabetes 05/25/2018   Morbid obesity (HCC) 05/25/2018   History of cesarean delivery 04/26/2015   History of PCOS 04/26/2015   Endometriosis determined by laparoscopy 04/26/2015    Past Surgical History:  Procedure Laterality Date   CESAREAN SECTION  2005   CESAREAN SECTION N/A 10/10/2015   Procedure: CESAREAN SECTION;  Surgeon: [name], MD;  Location: [facility];  Service: Obstetrics;  Laterality: N/A;   DIAGNOSTIC LAPAROSCOPY  2015   DILATION AND CURETTAGE OF UTERUS     TUBAL LIGATION  2016    OB History     Gravida  3   Para  2   Term  2   Preterm      AB  1   Living  0      SAB  1   IAB      Ectopic      Multiple  0   Live Births               Home Medications    Prior to Admission medications   Medication Sig Start Date End Date Taking? Authorizing Provider  brompheniramine-pseudoephedrine-DM 30-2-10  MG/5ML syrup Take 5 mLs by mouth 4 (four) times daily as needed. 10/15/23  Yes Leath-Warren, Sadie Haber, NP  lidocaine (XYLOCAINE) 2 % solution Gargle and spit 5 mL every 6 hours as needed for throat pain or discomfort. 10/15/23  Yes Leath-Warren, Sadie Haber, NP  predniSONE (DELTASONE) 20 MG tablet Take 2 tablets (40 mg total) by mouth daily with breakfast for 5 days. 10/15/23 10/20/23 Yes Leath-Warren, Sadie Haber, NP  DULoxetine (CYMBALTA) 20 MG capsule Take 2 capsules (40 mg total) by mouth daily. 08/12/21 08/12/22  [name], MD  fluticasone (FLONASE) 50 MCG/ACT nasal spray Place 1 spray into both nostrils 2 (two) times daily. 09/24/22   Particia Nearing, PA-C  hydrochlorothiazide (HYDRODIURIL) 25 MG tablet Take 25 mg by mouth daily.    [provider]  levocetirizine (XYZAL) 5 MG tablet Take 1 tablet (5 mg total) by mouth every evening. 12/12/21   Wallis Bamberg, PA-C  norethindrone (AYGESTIN) 5 MG tablet Take 5 mg by mouth daily.    [provider]  phenazopyridine (PYRIDIUM) 100 MG tablet Take 1 tablet (100 mg total) by mouth 3 (three) times daily as needed for pain. 10/06/22   Leath-Warren, Sadie Haber, NP  promethazine-dextromethorphan (PROMETHAZINE-DM)  6.25-15 MG/5ML syrup Take 5 mLs by mouth 4 (four) times daily as needed. 09/24/22   Particia Nearing, PA-C  Semaglutide, 1 MG/DOSE, (OZEMPIC, 1 MG/DOSE,) 4 MG/3ML SOPN Inject 1 mg into the skin once a week. 01/14/22   Nadara Mustard, MD  traZODone (DESYREL) 50 MG tablet Take 1 tablet (50 mg total) by mouth at bedtime as needed for sleep. 10/25/18 08/03/20  [name], MD    Family History Family History  Problem Relation Age of Onset   Hypertension Mother    Hypertension Father    Stroke Maternal Grandfather    Breast cancer Paternal Aunt 4    Social History Social History   Tobacco Use   Smoking status: Former    Current packs/day: 0.00    Average packs/day: 0.5 packs/day for 15.0 years (7.5  ttl pk-yrs)    Types: Cigarettes    Start date: 10/20/1996    Quit date: 10/21/2011    Years since quitting: 11.9    Passive exposure: Never   Smokeless tobacco: Never  Vaping Use   Vaping status: Never Used  Substance Use Topics   Alcohol use: Yes    Comment: Occas   Drug use: No     Allergies   Venlafaxine   Review of Systems Review of Systems Per HPI  Physical Exam Triage Vital Signs ED Triage Vitals  Encounter Vitals Group     BP 10/15/23 1155 (!) 146/92     Systolic BP Percentile --      Diastolic BP Percentile --      Pulse Rate 10/15/23 1155 74     Resp 10/15/23 1155 17     Temp 10/15/23 1155 98.1 F (36.7 C)     Temp Source 10/15/23 1155 Oral     SpO2 10/15/23 1155 97 %     Weight --      Height --      Head Circumference --      Peak Flow --      Pain Score 10/15/23 1201 5     Pain Loc --      Pain Education --      Exclude from Growth Chart --    No data found.  Updated Vital Signs BP (!) 146/92 (BP Location: Right Arm)   Pulse 74   Temp 98.1 F (36.7 C) (Oral)   Resp 17   LMP 09/13/2023 (Approximate)   SpO2 97%   Visual Acuity Right Eye Distance:   Left Eye Distance:   Bilateral Distance:    Right Eye Near:   Left Eye Near:    Bilateral Near:     Physical Exam Vitals and nursing note reviewed.  Constitutional:      General: She is not in acute distress.    Appearance: Normal appearance.  HENT:     Head: Normocephalic.     Right Ear: Tympanic membrane, ear canal and external ear normal.     Left Ear: Tympanic membrane, ear canal and external ear normal.     Nose: Congestion present.     Right Turbinates: Enlarged and swollen.     Left Turbinates: Enlarged and swollen.     Right Sinus: No maxillary sinus tenderness or frontal sinus tenderness.     Left Sinus: No maxillary sinus tenderness or frontal sinus tenderness.     Mouth/Throat:     Lips: Pink.     Mouth: Mucous membranes are moist.     Pharynx: Pharyngeal swelling,  posterior oropharyngeal  erythema and postnasal drip present.     Comments: Cobblestoning present to posterior oropharynx  Eyes:     Extraocular Movements: Extraocular movements intact.     Conjunctiva/sclera: Conjunctivae normal.     Pupils: Pupils are equal, round, and reactive to light.  Cardiovascular:     Rate and Rhythm: Normal rate and regular rhythm.     Pulses: Normal pulses.     Heart sounds: Normal heart sounds.  Pulmonary:     Effort: Pulmonary effort is normal.     Breath sounds: Normal breath sounds.  Abdominal:     General: Bowel sounds are normal.     Palpations: Abdomen is soft.     Tenderness: There is no abdominal tenderness.  Musculoskeletal:     Cervical back: Normal range of motion.  Lymphadenopathy:     Cervical: No cervical adenopathy.  Skin:    General: Skin is warm and dry.  Neurological:     General: No focal deficit present.     Mental Status: She is alert and oriented to person, place, and time.  Psychiatric:        Mood and Affect: Mood normal.        Behavior: Behavior normal.      UC Treatments / Results  Labs (all labs ordered are listed, but only abnormal results are displayed) Labs Reviewed  POCT RAPID STREP A (OFFICE)  POC COVID19/FLU A&B COMBO    EKG   Radiology DG Chest 2 View Result Date: 10/15/2023 CLINICAL DATA:  47 year old female with cough for 2 months. Former smoker. EXAM: CHEST - 2 VIEW COMPARISON:  None Available. FINDINGS: PA and lateral views 1222 hours. Lung volumes and mediastinal contours are within normal limits. Visualized tracheal air column is within normal limits. Lung markings are within normal limits. Both lungs appear clear. No pneumothorax or pleural effusion. No acute osseous abnormality identified. Negative visible bowel gas. IMPRESSION: Negative.  No acute cardiopulmonary abnormality. Electronically Signed   By: Odessa Fleming M.D.   On: 10/15/2023 12:40    Procedures Procedures (including critical care  time)  Medications Ordered in UC Medications - No data to display  Initial Impression / Assessment and Plan / UC Course  I have reviewed the triage vital signs and the nursing notes.  Pertinent labs & imaging results that were available during my care of the patient were reviewed by me and considered in my medical decision making (see chart for details).  The rapid strep test and COVID/influenza test were negative.  A throat culture is pending.  Chest x-ray is also pending.  Do suspect viral etiology causing the patient's symptoms at this time.  Will provide symptomatic treatment for patient's sore throat with viscous lidocaine 2% for patient to gargle and spit, for her cough, prednisone 40 mg and Bromfed-DM was prescribed pending the x-ray result.  Supportive care recommendations were provided and discussed with the patient to include fluids, rest, use of a humidifier at nighttime during sleep, and sleeping slightly elevated.  Discussed indications with the patient regarding when follow-up be necessary.  Patient was in agreement with this plan of care and verbalized understanding.  All questions were answered.  Patient stable for discharge.  Final Clinical Impressions(s) / UC Diagnoses   Final diagnoses:  Cough, unspecified type  Acute pharyngitis, unspecified etiology     Discharge Instructions      The COVID/flu test and rapid strep test were negative.  A throat culture is pending.  You will be contacted  if the pending test result is positive.  You also have access to the results via MyChart. Chest x-ray is also pending.  You will be contacted if the result of the x-ray is abnormal. Take medication as prescribed. Increase fluids and allow for plenty of rest. Recommend over-the-counter ibuprofen or Tylenol as needed for pain, fever, or general discomfort. For your cough, recommend using a humidifier at nighttime during sleep and sleeping slightly elevated on pillows while cough symptoms  persist. If symptoms do not improve, or appear to be worsening, you may follow-up in this clinic or with your primary care physician for further evaluation. Follow-up as needed.     ED Prescriptions     Medication Sig Dispense Auth. Provider   predniSONE (DELTASONE) 20 MG tablet Take 2 tablets (40 mg total) by mouth daily with breakfast for 5 days. 10 tablet Leath-Warren, Sadie Haber, NP   lidocaine (XYLOCAINE) 2 % solution Gargle and spit 5 mL every 6 hours as needed for throat pain or discomfort. 100 mL Leath-Warren, Sadie Haber, NP   brompheniramine-pseudoephedrine-DM 30-2-10 MG/5ML syrup Take 5 mLs by mouth 4 (four) times daily as needed. 140 mL Leath-Warren, Sadie Haber, NP      PDMP not reviewed this encounter.   Abran Cantor, NP 10/15/23 1304

## 2023-10-15 NOTE — ED Triage Notes (Signed)
Pt reports cough since November, reports symptoms worsened approximately a week ago and reports intense sore throat. Denies any gi/gu symptoms. Low grade fever yesterday.

## 2024-06-24 ENCOUNTER — Ambulatory Visit: Payer: Self-pay

## 2024-06-29 ENCOUNTER — Other Ambulatory Visit: Payer: Self-pay | Admitting: Obstetrics and Gynecology

## 2024-06-29 DIAGNOSIS — Z1231 Encounter for screening mammogram for malignant neoplasm of breast: Secondary | ICD-10-CM

## 2024-08-12 ENCOUNTER — Ambulatory Visit
Admission: RE | Admit: 2024-08-12 | Discharge: 2024-08-12 | Disposition: A | Discharge status: Home / Self Care | Source: Ambulatory Visit | Attending: Obstetrics and Gynecology | Admitting: Obstetrics and Gynecology

## 2024-08-12 DIAGNOSIS — Z1231 Encounter for screening mammogram for malignant neoplasm of breast: Secondary | ICD-10-CM | POA: Diagnosis present
